# Patient Record
Sex: Male | Born: 1950 | Race: White | Hispanic: No | Smoking: Former smoker
Health system: Southern US, Community
[De-identification: ages and names within clinical notes are randomized; demographics above are authoritative.]

---

## 2008-08-24 ENCOUNTER — Ambulatory Visit: Payer: Self-pay | Admitting: Internal Medicine

## 2008-09-01 ENCOUNTER — Ambulatory Visit: Payer: Self-pay | Admitting: Internal Medicine

## 2009-07-19 ENCOUNTER — Ambulatory Visit: Payer: Self-pay | Admitting: Gastroenterology

## 2010-03-03 DIAGNOSIS — L570 Actinic keratosis: Secondary | ICD-10-CM

## 2010-03-03 HISTORY — DX: Actinic keratosis: L57.0

## 2012-05-03 ENCOUNTER — Ambulatory Visit: Payer: Self-pay | Admitting: Sports Medicine

## 2012-07-08 ENCOUNTER — Ambulatory Visit: Payer: Self-pay | Admitting: Anesthesiology

## 2012-07-08 DIAGNOSIS — I10 Essential (primary) hypertension: Secondary | ICD-10-CM

## 2012-07-08 LAB — BASIC METABOLIC PANEL
Anion Gap: 8 (ref 7–16)
Calcium, Total: 8 mg/dL — ABNORMAL LOW (ref 8.5–10.1)
Chloride: 110 mmol/L — ABNORMAL HIGH (ref 98–107)
Co2: 25 mmol/L (ref 21–32)
Creatinine: 0.81 mg/dL (ref 0.60–1.30)
EGFR (African American): >60
EGFR (Non-African Amer.): >60
Glucose: 161 mg/dL — ABNORMAL HIGH (ref 65–99)
Osmolality: 287 (ref 275–301)
Sodium: 143 mmol/L (ref 136–145)

## 2012-07-11 ENCOUNTER — Ambulatory Visit: Payer: Self-pay | Admitting: Orthopedic Surgery

## 2015-01-19 NOTE — Op Note (Signed)
PATIENT NAME:  Ronnie Buckley, Ronnie Buckley MR#:  [id number] DATE OF BIRTH:  07/20/1951  DATE OF PROCEDURE:  07/11/2012  PREOPERATIVE DIAGNOSIS: Right knee osteoarthritis, medial meniscus tear.   POSTOPERATIVE DIAGNOSIS: Right knee osteoarthritis, medial meniscus tear.   PROCEDURE: Arthroscopy of right knee, partial medial meniscectomy.   SURGEON: Laurene Footman, M.D.   ANESTHESIA: General.  DESCRIPTION OF PROCEDURE: The patient was brought to the Operating Room and, after adequate general anesthesia was obtained, the right leg was prepped and draped in the usual sterile fashion with the arthroscopic legholder and tourniquet applied; the tourniquet was not required. After appropriate patient identification and time out procedures were completed, an inferolateral portal was made. The arthroscope was introduced. Initial inspection revealed significant degenerative change with full thickness loss within the femoral trochlea, fissuring, and significant articular loss to the patella. The suprapatellar pouch was normal. The gutters were checked and there were no loose bodies. Coming around medially, an inferomedial portal was made and on probing there was a significant tear of the posterior horn of the medial meniscus involving approximately two thirds of the depth of the posterior horn from the junction of the middle and posterior thirds to the middle of the posterior third. There was fibrillation of the articular cartilage, in the medial compartment, without fissuring and no exposed bone. The anterior cruciate ligament was intact. The lateral compartment was essentially normal with just some fibrillation of the articular cartilage and some central degenerative changes. Meniscal punch and ArthroCare wand were used to debride the meniscal tear back to a stable margin. After this was completed, an arthroscopic portal was inserted to remove the large fragments from the meniscal punch. The knee was then irrigated until  clear. There were no complications and no specimen. After removal of all instrumentation, the wounds were closed with simple interrupted 4-0 nylon. 20 mL of 0.5% Sensorcaine with epinephrine was infiltrated into the portals. Xeroform, 4 x 4's, Webril, and Ace wrap were applied.   COMPLICATIONS: None.   SPECIMEN: None.   IMPLANTS: None.   ESTIMATED BLOOD LOSS: Minimal.  ____________________________ Laurene Footman, MD mjm:slb D: 07/11/2012 23:08:22 ET T: 07/12/2012 10:19:17 ET JOB#: [id number]  cc: Laurene Footman, MD, <Dictator> Laurene Footman MD ELECTRONICALLY SIGNED 07/12/2012 15:17

## 2015-12-02 DIAGNOSIS — M170 Bilateral primary osteoarthritis of knee: Secondary | ICD-10-CM | POA: Insufficient documentation

## 2018-10-24 DIAGNOSIS — E1165 Type 2 diabetes mellitus with hyperglycemia: Secondary | ICD-10-CM | POA: Insufficient documentation

## 2020-01-21 ENCOUNTER — Ambulatory Visit (INDEPENDENT_AMBULATORY_CARE_PROVIDER_SITE_OTHER): Payer: Medicare HMO | Admitting: Dermatology

## 2020-01-21 ENCOUNTER — Other Ambulatory Visit: Payer: Self-pay

## 2020-01-21 DIAGNOSIS — D692 Other nonthrombocytopenic purpura: Secondary | ICD-10-CM

## 2020-01-21 DIAGNOSIS — L578 Other skin changes due to chronic exposure to nonionizing radiation: Secondary | ICD-10-CM

## 2020-01-21 DIAGNOSIS — L570 Actinic keratosis: Secondary | ICD-10-CM

## 2020-01-21 DIAGNOSIS — L821 Other seborrheic keratosis: Secondary | ICD-10-CM | POA: Diagnosis not present

## 2020-01-21 NOTE — Progress Notes (Signed)
   Follow-Up Visit   Subjective  Ronnie Buckley is a 69 y.o. male who presents for the following: Actinic Keratosis (3 months f/u, hx of Aks, treated scalp and face with PDT 3 months ago with a good response).   The following portions of the chart were reviewed this encounter and updated as appropriate:  Allergies  Meds  Problems  Med Hx  Surg Hx  Fam Hx      Review of Systems:  No other skin or systemic complaints except as noted in HPI or Assessment and Plan.  Objective  Well appearing patient in no apparent distress; mood and affect are within normal limits.  A focused examination was performed including face,scalp . Relevant physical exam findings are noted in the Assessment and Plan.  Objective  face, scalp (9): Erythematous thin papules/macules with gritty scale.    Assessment & Plan   Actinic Damage - diffuse scaly erythematous macules with underlying dyspigmentation - Recommend daily broad spectrum sunscreen SPF 30+ to sun-exposed areas, reapply every 2 hours as needed.  - Call for new or changing lesions. Purpura - Violaceous macules and patches - Benign - Related to age, sun damage and/or use of blood thinners - Observe - Can use OTC arnica containing moisturizer such as Dermend Bruise Formula if desired - Call for worsening or other concerns Seborrheic Keratoses - Stuck-on, waxy, tan-brown papules and plaques  - Discussed benign etiology and prognosis. - Observe - Call for any changes AK (actinic keratosis) (9) face, scalp  Destruction of lesion - face, scalp Complexity: simple   Destruction method: cryotherapy   Informed consent: discussed and consent obtained   Timeout:  patient name, date of birth, surgical site, and procedure verified Lesion destroyed using liquid nitrogen: Yes   Region frozen until ice ball extended beyond lesion: Yes   Outcome: patient tolerated procedure well with no complications   Post-procedure details: wound care  instructions given    Return in about 6 months (around 07/22/2020) for Aks .  IMarye Round, CMA, am acting as scribe for Sarina Ser, MD .  Documentation: I have reviewed the above documentation for accuracy and completeness, and I agree with the above.  Sarina Ser, MD

## 2020-01-22 ENCOUNTER — Encounter: Payer: Self-pay | Admitting: Dermatology

## 2020-07-08 DIAGNOSIS — C029 Malignant neoplasm of tongue, unspecified: Secondary | ICD-10-CM | POA: Insufficient documentation

## 2020-07-21 ENCOUNTER — Ambulatory Visit: Payer: Medicare HMO | Admitting: Dermatology

## 2020-08-02 DIAGNOSIS — E7800 Pure hypercholesterolemia, unspecified: Secondary | ICD-10-CM | POA: Insufficient documentation

## 2020-08-02 DIAGNOSIS — I10 Essential (primary) hypertension: Secondary | ICD-10-CM | POA: Insufficient documentation

## 2020-08-23 DIAGNOSIS — S81801S Unspecified open wound, right lower leg, sequela: Secondary | ICD-10-CM | POA: Insufficient documentation

## 2020-08-23 DIAGNOSIS — R1312 Dysphagia, oropharyngeal phase: Secondary | ICD-10-CM | POA: Insufficient documentation

## 2020-08-23 DIAGNOSIS — Z9049 Acquired absence of other specified parts of digestive tract: Secondary | ICD-10-CM | POA: Insufficient documentation

## 2020-08-23 DIAGNOSIS — F800 Phonological disorder: Secondary | ICD-10-CM | POA: Insufficient documentation

## 2020-09-06 ENCOUNTER — Telehealth: Payer: Self-pay | Admitting: *Deleted

## 2020-09-06 ENCOUNTER — Inpatient Hospital Stay: Payer: Medicare HMO | Attending: Oncology | Admitting: Oncology

## 2020-09-06 ENCOUNTER — Other Ambulatory Visit: Payer: Self-pay

## 2020-09-06 ENCOUNTER — Ambulatory Visit
Admission: RE | Admit: 2020-09-06 | Discharge: 2020-09-06 | Disposition: A | Discharge status: Home / Self Care | Payer: Medicare HMO | Source: Ambulatory Visit | Attending: Radiation Oncology | Admitting: Radiation Oncology

## 2020-09-06 ENCOUNTER — Inpatient Hospital Stay: Payer: Medicare HMO

## 2020-09-06 ENCOUNTER — Other Ambulatory Visit: Payer: Self-pay | Admitting: *Deleted

## 2020-09-06 ENCOUNTER — Encounter: Payer: Self-pay | Admitting: Oncology

## 2020-09-06 VITALS — BP 110/60 | HR 86 | Temp 97.1°F | Resp 12 | Wt 199.7 lb

## 2020-09-06 VITALS — BP 110/60 | HR 86 | Temp 97.1°F | Resp 12 | Ht 70.0 in | Wt 199.9 lb

## 2020-09-06 DIAGNOSIS — I10 Essential (primary) hypertension: Secondary | ICD-10-CM | POA: Insufficient documentation

## 2020-09-06 DIAGNOSIS — Z7982 Long term (current) use of aspirin: Secondary | ICD-10-CM | POA: Insufficient documentation

## 2020-09-06 DIAGNOSIS — R1312 Dysphagia, oropharyngeal phase: Secondary | ICD-10-CM | POA: Insufficient documentation

## 2020-09-06 DIAGNOSIS — E1165 Type 2 diabetes mellitus with hyperglycemia: Secondary | ICD-10-CM | POA: Diagnosis not present

## 2020-09-06 DIAGNOSIS — D649 Anemia, unspecified: Secondary | ICD-10-CM | POA: Diagnosis not present

## 2020-09-06 DIAGNOSIS — C029 Malignant neoplasm of tongue, unspecified: Secondary | ICD-10-CM

## 2020-09-06 DIAGNOSIS — E7800 Pure hypercholesterolemia, unspecified: Secondary | ICD-10-CM | POA: Insufficient documentation

## 2020-09-06 DIAGNOSIS — Z7189 Other specified counseling: Secondary | ICD-10-CM | POA: Diagnosis not present

## 2020-09-06 DIAGNOSIS — C01 Malignant neoplasm of base of tongue: Secondary | ICD-10-CM | POA: Insufficient documentation

## 2020-09-06 DIAGNOSIS — Z7984 Long term (current) use of oral hypoglycemic drugs: Secondary | ICD-10-CM | POA: Insufficient documentation

## 2020-09-06 DIAGNOSIS — Z79899 Other long term (current) drug therapy: Secondary | ICD-10-CM | POA: Diagnosis not present

## 2020-09-06 DIAGNOSIS — Z9221 Personal history of antineoplastic chemotherapy: Secondary | ICD-10-CM | POA: Diagnosis not present

## 2020-09-06 DIAGNOSIS — Z930 Tracheostomy status: Secondary | ICD-10-CM | POA: Insufficient documentation

## 2020-09-06 NOTE — Telephone Encounter (Signed)
Called the office and spoke to tabitha and she read the notes from Dr. Nicolette Bang and it says that when he sees him next he will remove the trach at that time and get g tube insertion. Dr. Janese Banks wanted to know if pt could get port placed when he got thes procedures done.  Lawerance Bach said she would leave the doctor a note and see what he says. She did state that if he only needs Gtube it is doen by general surgery and Dr. Nicolette Bang would need to put order in to get port placed. She  will call me back with answer

## 2020-09-06 NOTE — Consult Note (Signed)
NEW PATIENT EVALUATION  Name: Ronnie Buckley  MRN: [id number]  Date:   09/06/2020     DOB: 11/01/1950   This 69 y.o. 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  Total glossectomy trach bilateral lymph node dissection.  REFERRING PHYSICIAN: Fredricka Bonine, *  CHIEF COMPLAINT:  Chief Complaint  Patient presents with  . tongue cancer    DIAGNOSIS: The encounter diagnosis was Tongue cancer ([place]).   PREVIOUS INVESTIGATIONS:  PET CT scan ordered CT scan report reviewed Pathology report reviewed Clinical notes reviewed  HPI: Patient is a 69 year old male who is now status post total glossectomy bilateral lymph node dissection and tracheostomy for a 4.6 cm moderately differentiated squamous cell carcinoma with margins positive in the deep and lateral edges lymphovascular invasion identified as well as perineural invasion as well as 1 lymph node in the neck dissection positive for metastatic disease.  He initially presented with a left oral tongue lesion which gradually worsened.  He did develop some minor speech changes and dysphagia.  Biopsy was performed showing squamous cell carcinoma.  He underwent above needed noted surgery with positive margins and 1 lymph node involved.  Tumor board at [facility] has recommended concurrent chemoradiation therapy in the adjuvant setting.  He is seen today is doing fair he still has open wound in his left neck with which is hemorrhaging slightly.  He has a tracheostomy and has a feeding tube at this time.  Patient to my knowledge and review of data has never had a PET CT scan.  He is seen today for both medical oncology and radiation oncology opinion.  PLANNE IMRT radiation therapy with concurrent chemotherapy D TREATMENT REGIMEN: As above  PAST MEDICAL HISTORY:  has no past medical history on file.    PAST SURGICAL  HISTORY: No past surgical history on file.  FAMILY HISTORY: family history is not on file.  SOCIAL HISTORY:  reports previous alcohol use. He reports that he does not use drugs.  ALLERGIES: Patient has no allergy information on record.  MEDICATIONS:  Current Outpatient Medications  Medication Sig Dispense Refill  . acetaminophen (TYLENOL) 500 MG tablet Take 2 tablets by mouth every 6 (six) hours as needed.    Marland Kitchen aspirin 325 MG tablet Take 1 tablet by mouth in the morning and at bedtime.    . enalapril (VASOTEC) 10 MG tablet Take 10 mg by mouth daily.    . metFORMIN (GLUCOPHAGE) 500 MG tablet Take 1,000 mg by mouth 2 (two) times daily.    . Multiple Vitamin (MULTI-VITAMIN) tablet Take 1 tablet by mouth daily.    . pravastatin (PRAVACHOL) 40 MG tablet Take 1 tablet by mouth daily.     No current facility-administered medications for this encounter.    ECOG PERFORMANCE STATUS:  1 - Symptomatic but completely ambulatory  REVIEW OF SYSTEMS: Patient denies any weight loss, fatigue, weakness, fever, chills or night sweats. Patient denies any loss of vision, blurred vision. Patient denies any ringing  of the ears or hearing loss. No irregular heartbeat. Patient denies heart murmur or history of fainting. Patient denies any chest pain or pain radiating to her upper extremities. Patient denies any shortness of breath, difficulty breathing at night, cough or hemoptysis. Patient denies any swelling in the lower legs. Patient denies any nausea vomiting, vomiting of blood, or coffee ground material in the vomitus. Patient denies any stomach  pain. Patient states has had normal bowel movements no significant constipation or diarrhea. Patient denies any dysuria, hematuria or significant nocturia. Patient denies any problems walking, swelling in the joints or loss of balance. Patient denies any skin changes, loss of hair or loss of weight. Patient denies any excessive worrying or anxiety or significant  depression. Patient denies any problems with insomnia. Patient denies excessive thirst, polyuria, polydipsia. Patient denies any swollen glands, patient denies easy bruising or easy bleeding. Patient denies any recent infections, allergies or URI. Patient "s visual fields have not changed significantly in recent time.   PHYSICAL EXAM: BP 110/60 (BP Location: Left Arm, Patient Position: Sitting, Cuff Size: Large)   Pulse 86   Temp (!) 97.1 F (36.2 C)   Resp 12   Wt 199 lb 11.2 oz (90.6 kg)  Patient has a feeding tube placed.  He also has a tracheostomy which is functioning well.  He does have some bandaged although ulcerative area in his left neck consistent with healing of his neck dissection.  Well-developed well-nourished patient in NAD. HEENT reveals PERLA, EOMI, discs not visualized.  Oral cavity is clear. No oral mucosal lesions are identified. Neck is clear without evidence of cervical or supraclavicular adenopathy. Lungs are clear to A&P. Cardiac examination is essentially unremarkable with regular rate and rhythm without murmur rub or thrill. Abdomen is benign with no organomegaly or masses noted. Motor sensory and DTR levels are equal and symmetric in the upper and lower extremities. Cranial nerves II through XII are grossly intact. Proprioception is intact. No peripheral adenopathy or edema is identified. No motor or sensory levels are noted. Crude visual fields are within normal range.  LABORATORY DATA: Pathology report reviewed    RADIOLOGY RESULTS: CT report reviewed PET CT scan ordered   IMPRESSION: Locally advanced squamous cell carcinoma of the tongue status post total glossectomy bilateral lymph node dissection tracheostomy in 69 year old male  PLAN: At this time I like to order a PET CT scan to evaluate if there is any possible gross residual disease.  We will plan on delivering adjuvant radiation therapy to his head and neck based on PET/CT findings as well as his CT scans.   Would plan on delivering 60 Gray over 6 weeks with concurrent chemotherapy to his left neck as well as area of original tumor involvement.  Do not see need to treat other areas of lymph node involvement in his head and neck region.  Risks and benefits of treatment including loss of taste oral mucositis fatigue possible radiation esophagitis skin reaction alteration of blood counts and xerostomia all were reviewed in detail with the patient.  He comprehends her treatment plan well.  I have set him up for CT simulation after his PET CT scan in about 3 weeks time to allow some more healing of his open neck wound.  He is also seeing medical oncology today and will discuss the case personally with her when she has consult with patient.  I would like to take this opportunity to thank you for allowing me to participate in the care of your patient.Noreene Filbert, MD

## 2020-09-08 ENCOUNTER — Encounter: Payer: Self-pay | Admitting: Oncology

## 2020-09-08 DIAGNOSIS — Z7189 Other specified counseling: Secondary | ICD-10-CM | POA: Insufficient documentation

## 2020-09-08 NOTE — Progress Notes (Signed)
START ON PATHWAY REGIMEN - Head and Neck     A cycle is every 7 days:     Cisplatin   **Always confirm dose/schedule in your pharmacy ordering system**  Patient Characteristics: Oral Cavity, Postoperative without Neoadjuvant Therapy, ENE or Positive Margins Disease Classification: Oral Cavity AJCC T Category: T4a AJCC M Category: M0 AJCC N Category: pN1 AJCC 8 Stage Grouping: IVA Therapeutic Status: Postoperative without Neoadjuvant Therapy Intent of Therapy: Curative Intent, Discussed with Patient

## 2020-09-08 NOTE — Progress Notes (Signed)
Hematology/Oncology Consult note [facility] Telephone:([phone] [phone]  Patient Care Team: Patient, No Pcp Per as PCP - General (General Practice)   Name of the patient: Ronnie Buckley  [id number]  December 27, 1950    Reason for referral-new diagnosis of tongue cancer   Referring physician-Dr. Elisabeth Cara  Date of visit: 09/08/20   History of presenting illness- Patient is a 69 year old male who presented to [facility] ENT Dr. Conley Canal for a lesion that he identified on his left tongue.  Biopsy on 06/15/2020 was consistent with squamous cell carcinoma.  This was followed by a CT neck and chest.  CT neck showed a large poorly defined infiltrative mass throughout the posterior left oral tongue and left oral base extending to the left glossotonsillar sulcus.  The mass crosses midline through the lingual septum into the right hemitongue.  Findings are concerning for left lateral retropharyngeal as well as level 1B283 and 4 nodal metastases.  CT chest without contrast did not show any evidence of metastatic disease.  Patient then underwent total glossectomy with bilateral neck dissection on 07/15/2020.  Pathology showed:Surgical Pathology Final Pathologic Diagnosis  A. LYMPH NODE, LEFT LEVEL 1B, DISSECTION: Benign salivary gland tissue. One out of six lymph nodes, positive for metastatic carcinoma (1/6).  B. LYMPH NODES, LEFT LEVEL 2A, 3, & 4, DISSECTION: Sixteen lymph nodes, negative for metastatic carcinoma (0/16).  C. LYMPH NODES, LEFT LEVEL 2B, DISSECTION: Seven lymph nodes, negative for metastatic carcinoma (0/7).  D. LYMPH NODES, LEFT LEVEL 1A, DISSECTION: One lymph node, negative for metastatic carcinoma (0/1).  E. LYMPH NODES, RIGHT LEVEL 2A, 3, &4, DISSECTION: Eleven lymph nodes, negative for metastatic carcinoma (0/11).  F. LYMPH NODES, RIGHT LEVEL 2A AND 2B, DISSECTION: Eleven lymph nodes, negative for metastatic carcinoma(0/11).  G.  LINGUAL NERVE, RIGHT, EXCISION: Benign nerve tissue. No malignancy identified.  H. LYMPH NODES, RIGHT LEVEL 1B, DISSECTION: Benign salivary gland tissue. Three lymph nodes, negative for metastatic carcinoma. (0/3).  I. LINGUAL NERVE, PROXIMAL LEFT MARGIN, EXCISION: Benign peripheral nerve. No malignancy identified.  J. FLOOR OF MOUTH, LEFT MARGIN, EXCISION: Benign oropharyngeal mucosa. No malignancy identified.  K. SOFT PALATE, LEFT MARGIN, EXCISION: Benign oropharyngeal mucosa. No malignancy identified.  L. PHARYNGEAL MUCOSAL MARGIN, LEFT, EXCISION: Benign oropharyngeal mucosa. No malignancy identified.  M. VALLECULA MUCOSA MARGIN, EXCISION: Benign oropharyngeal mucosa. No malignancy identified.  N. PTERYGOID, DEEP TONSILLAR FOSSA MARGIN, EXCISION: Benign soft tissue. No malignancy identified.  O. TONGUE, TOTAL GLOSSECTOMY: Invasive moderately differentiated squamous cell carcinoma, keratinizing (4.6 cm). Tumor focally present at the deep and left tissue edges, please see separated margin status.  Lymphovascular invasion identified.  Perineural invasion is identified. Pathologic stage: pT4a pN1.  See CAP synoptic report.    Patient was evaluated by radiation oncology at [facility] and plan was to offer adjuvant concurrent chemoradiation.  However patient lives in [place] and prefers to get care locally.  He currently has a tracheostomy in place but has not used it in 2 weeks and plans to take it off in the next few weeks.  He will also need PEG tube for feeding as he still has oropharyngeal dysphagia following glossectomy.  Currently he has a NG tube in place for feeding.  ECOG PS- 1 Pain scale- 0   Review of systems- Review of Systems  Constitutional: Positive for malaise/fatigue and weight loss. Negative for chills and fever.  HENT: Negative for congestion, ear discharge and nosebleeds.   Eyes: Negative for blurred vision.  Respiratory: Negative for  cough, hemoptysis, sputum production, shortness of breath and wheezing.   Cardiovascular: Negative for chest pain, palpitations, orthopnea and claudication.  Gastrointestinal: Negative for abdominal pain, blood in stool, constipation, diarrhea, heartburn, melena, nausea and vomiting.  Genitourinary: Negative for dysuria, flank pain, frequency, hematuria and urgency.  Musculoskeletal: Negative for back pain, joint pain and myalgias.  Skin: Negative for rash.  Neurological: Negative for dizziness, tingling, focal weakness, seizures, weakness and headaches.  Endo/Heme/Allergies: Does not bruise/bleed easily.  Psychiatric/Behavioral: Negative for depression and suicidal ideas. The patient does not have insomnia.     Not on File  Patient Active Problem List   Diagnosis Date Noted  . Open wound of leg without complication, right, sequela 08/23/2020  . Oropharyngeal dysphagia 08/23/2020  . S/P glossectomy 08/23/2020  . 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 Hypercholesterolemia 08/02/2020  . Hypertension 08/02/2020  . Tongue cancer ([place]) 07/08/2020  . Type 2 diabetes mellitus with hyperglycemia, without long-term current use of insulin ([place]) 10/24/2018  . Primary osteoarthritis of both knees 12/02/2015     History reviewed. No pertinent past medical history.   History reviewed. No pertinent surgical history.  Social History   Socioeconomic History  . Marital status: Married    Spouse name: Not on file  . Number of children: Not on file  . Years of education: Not on file  . Highest education level: Not on file  Occupational History  . Not on file  Tobacco Use  . Smoking status: Not on file  Substance and Sexual Activity  . Alcohol use: Not Currently  . Drug use: Never  . Sexual activity: Not Currently  Other Topics Concern  . Not on file  Social History Narrative  . Not on file   Social Determinants of Health   Financial Resource Strain:   . Difficulty of Paying  Living Expenses: Not on file  Food Insecurity:   . Worried About Charity fundraiser in the Last Year: Not on file  . Ran Out of Food in the Last Year: Not on file  Transportation Needs:   . Lack of Transportation (Medical): Not on file  . Lack of Transportation (Non-Medical): Not on file  Physical Activity:   . Days of Exercise per Week: Not on file  . Minutes of Exercise per Session: Not on file  Stress:   . Feeling of Stress : Not on file  Social Connections:   . Frequency of Communication with Friends and Family: Not on file  . Frequency of Social Gatherings with Friends and Family: Not on file  . Attends Religious Services: Not on file  . Active Member of Clubs or Organizations: Not on file  . Attends Archivist Meetings: Not on file  . Marital Status: Not on file  Intimate Partner Violence:   . Fear of Current or Ex-Partner: Not on file  . Emotionally Abused: Not on file  . Physically Abused: Not on file  . Sexually Abused: Not on file     History reviewed. No pertinent family history.   Current Outpatient Medications:  .  acetaminophen (TYLENOL) 500 MG tablet, Take 2 tablets by mouth every 6 (six) hours as needed., Disp: , Rfl:  .  aspirin 325 MG tablet, Take 1 tablet by mouth in the morning and at bedtime., Disp: , Rfl:  .  enalapril (VASOTEC) 10 MG tablet, Take 10 mg by mouth daily., Disp: , Rfl:  .  metFORMIN (GLUCOPHAGE) 500 MG tablet, Take 1,000 mg by mouth 2 (two)  times daily., Disp: , Rfl:  .  Multiple Vitamin (MULTI-VITAMIN) tablet, Take 1 tablet by mouth daily., Disp: , Rfl:  .  pravastatin (PRAVACHOL) 40 MG tablet, Take 1 tablet by mouth daily., Disp: , Rfl:    Physical exam:  Vitals:   09/06/20 1127  BP: 110/60  Pulse: 86  Resp: 12  Temp: (!) 97.1 F (36.2 C)  SpO2: 96%  Weight: 199 lb 14.4 oz (90.7 kg)   Physical Exam HENT:     Head: Normocephalic and atraumatic.     Mouth/Throat:     Comments: Patient is s/p total glossectomy with  reconstruction.  Speech is somewhat garbled but comprehensible.  Tracheostomy in place which is currently close.  NG tube in place Cardiovascular:     Rate and Rhythm: Normal rate and regular rhythm.     Heart sounds: Normal heart sounds.  Pulmonary:     Effort: Pulmonary effort is normal.     Breath sounds: Normal breath sounds.  Abdominal:     General: Bowel sounds are normal.     Palpations: Abdomen is soft.  Skin:    General: Skin is warm and dry.  Neurological:     Mental Status: He is alert and oriented to person, place, and time.        Assessment and plan- Patient is a 69 y.o. male with newly diagnosed squamous cell carcinoma of the oral cavity/tongue stage IVa pT4 apN1 cM0 s/p total glossectomy and bilateral neck dissection  Given that final pathology showed T4 lesion with perineural invasion as well as lymphovascular invasion, no ECE with questionable/close margins, patient would benefit from adjuvant concurrent chemoradiation.  Patient has been seen by radiation oncology today and will likely get 7 weeks of radiation.  I will plan to give him weekly cisplatin at 40 mg per metered square along with radiation.  Discussed risks and benefits of cisplatin including all but not limited to nausea, vomiting, low blood counts, risk of infections and hospitalization.  Risk of hair loss, peripheral neuropathy, hearing loss, kidney dysfunction associated with cisplatin.  Patient understands and agrees to proceed as planned.  Treatment will be given with a curative intent.  Patient will need a port placement which she can get done at [facility] when he has a feeding tube placed.  At the same time he is getting his tracheostomy taken out as well.  Dr. Donella Stade has also ordered a PET scan for him at this time.  Will need chemo teach prior to starting chemotherapy.  Patient noted to have normocytic anemia on his recent labs with normal renal functions.  I will do a complete anemia work-up  including CBC ferritin and iron studies B12 and folate and reticulocyte count on the day of his chemotherapy.  I will tentatively see him in 2 weeks time to start first cycle of weekly cisplatin chemotherapy  Cancer Staging Tongue cancer [facility]) Staging form: Oral Cavity, AJCC 8th Edition - Pathologic stage from 09/06/2020: Stage IVA (pT4a, pN1, cM0) - Signed by Sindy Guadeloupe, MD on 09/08/2020     Thank you for this kind referral and the opportunity to participate in the care of this patient   Visit Diagnosis 1. Goals of care, counseling/discussion   2. Tongue cancer ([place])     Dr. Randa Evens, MD, MPH [facility] [phone] 09/08/2020 9:40 AM

## 2020-09-13 ENCOUNTER — Other Ambulatory Visit: Payer: Self-pay | Admitting: *Deleted

## 2020-09-13 DIAGNOSIS — D649 Anemia, unspecified: Secondary | ICD-10-CM

## 2020-09-13 DIAGNOSIS — C029 Malignant neoplasm of tongue, unspecified: Secondary | ICD-10-CM

## 2020-09-16 ENCOUNTER — Encounter
Admission: RE | Admit: 2020-09-16 | Discharge: 2020-09-16 | Disposition: A | Discharge status: Home / Self Care | Payer: Medicare HMO | Source: Ambulatory Visit | Attending: Radiation Oncology | Admitting: Radiation Oncology

## 2020-09-16 ENCOUNTER — Other Ambulatory Visit: Payer: Self-pay

## 2020-09-16 DIAGNOSIS — I7090 Unspecified atherosclerosis: Secondary | ICD-10-CM | POA: Diagnosis not present

## 2020-09-16 DIAGNOSIS — R609 Edema, unspecified: Secondary | ICD-10-CM | POA: Insufficient documentation

## 2020-09-16 DIAGNOSIS — K8020 Calculus of gallbladder without cholecystitis without obstruction: Secondary | ICD-10-CM | POA: Diagnosis not present

## 2020-09-16 DIAGNOSIS — C029 Malignant neoplasm of tongue, unspecified: Secondary | ICD-10-CM | POA: Diagnosis present

## 2020-09-16 LAB — GLUCOSE, CAPILLARY: Glucose-Capillary: 141 mg/dL — ABNORMAL HIGH (ref 70–99)

## 2020-09-16 MED ORDER — FLUDEOXYGLUCOSE F - 18 (FDG) INJECTION
10.3000 | Freq: Once | INTRAVENOUS | Status: AC | PRN
  Administered 2020-09-16: 10:00:00 10.689 via INTRAVENOUS

## 2020-09-20 ENCOUNTER — Ambulatory Visit: Admission: RE | Admit: 2020-09-20 | Payer: Medicare HMO | Source: Ambulatory Visit

## 2020-09-27 ENCOUNTER — Ambulatory Visit: Payer: Medicare HMO

## 2020-09-27 NOTE — Patient Instructions (Signed)

## 2020-09-28 ENCOUNTER — Inpatient Hospital Stay: Payer: Medicare HMO | Admitting: Nurse Practitioner

## 2020-09-28 ENCOUNTER — Inpatient Hospital Stay: Payer: Medicare HMO

## 2020-09-28 DIAGNOSIS — C029 Malignant neoplasm of tongue, unspecified: Secondary | ICD-10-CM

## 2020-09-28 MED ORDER — LIDOCAINE-PRILOCAINE 2.5-2.5 % EX CREA: TOPICAL_CREAM | CUTANEOUS | 3 refills | Status: DC

## 2020-09-29 ENCOUNTER — Inpatient Hospital Stay: Payer: Medicare HMO | Attending: Nurse Practitioner | Admitting: Hospice and Palliative Medicine

## 2020-09-29 ENCOUNTER — Ambulatory Visit: Payer: Medicare HMO

## 2020-09-29 DIAGNOSIS — C029 Malignant neoplasm of tongue, unspecified: Secondary | ICD-10-CM

## 2020-09-29 NOTE — Progress Notes (Signed)
[facility] CARE CLINIC CONSULT NOTE [facility]  Telephone:([phone] [phone]  Patient Care Team: Barbette Reichmann, MD as PCP - General (Internal Medicine)   Name of the patient: Ronnie Buckley  [id number]  01-Feb-1951   I connected with Ronnie Buckley on 09/29/20 at 11:00 AM EST by telephone and verified that I am speaking with the correct person using two identifiers.  Location: Patient: home Provider: clinic   I discussed the limitations, risks, security and privacy concerns of performing an evaluation and management service by telephone and the availability of in person appointments. I also discussed with the patient that there may be a patient responsible charge related to this service. The patient expressed understanding and agreed to proceed.   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 male who presents to chemo care clinic today for initial meeting in preparation for starting chemotherapy. I introduced the chemo care clinic and we discussed that the role of the clinic is to assist those who are at an increased risk of emergency room visits and/or complications during the course of chemotherapy treatment. We discussed that the increased risk takes into account factors such as age, performance status, and co-morbidities. We also discussed that for some, this might include barriers to care such as not having a primary care provider, lack of insurance/transportation, or not being able to afford medications. We discussed that the goal of the program is to help prevent unplanned ER visits and help reduce complications during chemotherapy. We do this by discussing specific risk factors to each individual and identifying ways that we can help improve these risk factors and reduce barriers to care.   Hematology/Oncology History:  Oncology History  Tongue cancer  (HCC)  07/08/2020 Initial Diagnosis   Tongue cancer (HCC)   09/06/2020 Cancer Staging   Staging form: Oral Cavity, AJCC 8th Edition - Pathologic stage from 09/06/2020: Stage IVA (pT4a, pN1, cM0) - Signed by Creig Hines, MD on 09/08/2020   10/04/2020 -  Chemotherapy   The patient had dexamethasone (DECADRON) 4 MG tablet, 8 mg, Oral, Daily, 1 of 1 cycle, Start date: --, End date: -- palonosetron (ALOXI) injection 0.25 mg, 0.25 mg, Intravenous,  Once, 0 of 7 cycles CISplatin (PLATINOL) 84 mg in sodium chloride 0.9 % 250 mL chemo infusion, 40 mg/m2 = 84 mg, Intravenous,  Once, 0 of 7 cycles fosaprepitant (EMEND) 150 mg in sodium chloride 0.9 % 145 mL IVPB, 150 mg, Intravenous,  Once, 0 of 7 cycles  for chemotherapy treatment.       No Known Allergies   No past medical history on file.   No past surgical history on file.  Social History   Socioeconomic History  . Marital status: Married    Spouse name: Not on file  . Number of children: Not on file  . Years of education: Not on file  . Highest education level: Not on file  Occupational History  . Not on file  Tobacco Use  . Smoking status: Not on file  . Smokeless tobacco: Not on file  Substance and Sexual Activity  . Alcohol use: Not Currently  . Drug use: Never  . Sexual activity: Not Currently  Other Topics Concern  . Not on file  Social History Narrative  . Not on file   Social Determinants of Health   Financial Resource Strain: Not on file  Food  Insecurity: Not on file  Transportation Needs: Not on file  Physical Activity: Not on file  Stress: Not on file  Social Connections: Not on file  Intimate Partner Violence: Not on file    No family history on file.  Current Outpatient Medications  Medication Sig Dispense Refill  . acetaminophen (TYLENOL) 500 MG tablet Take 2 tablets by mouth every 6 (six) hours as needed.    Marland Kitchen aspirin 325 MG tablet Take 1 tablet by mouth in the morning and at bedtime.    .  enalapril (VASOTEC) 10 MG tablet Take 10 mg by mouth daily.    Marland Kitchen lidocaine-prilocaine (EMLA) cream Apply to affected area once 30 g 3  . metFORMIN (GLUCOPHAGE) 500 MG tablet Take 1,000 mg by mouth 2 (two) times daily.    . Multiple Vitamin (MULTI-VITAMIN) tablet Take 1 tablet by mouth daily.    . pravastatin (PRAVACHOL) 40 MG tablet Take 1 tablet by mouth daily.     No current facility-administered medications for this visit.     PERFORMANCE STATUS (ECOG) : 1 - Symptomatic but completely ambulatory  Review of Systems As noted above. Otherwise, a complete review of systems is negative.  Physical Exam Deferred   Assessment and Plan:    1. 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 High Risk for ER/Hospitalization during Chemotherapy: We discussed the role of the chemo care clinic and identified patient specific risk factors. I discussed that patient was identified as high risk primarily based on: H&N Cancer. We also discussed the role of the Symptom Management and Palliative Care Clinics at [facility] and methods of contacting clinic/provider. He denies needing specific assistance at this time.   Recommend referral to nutritionist and ST.  Patient is pending PEG at [facility] and will likely need ongoing nutritional follow-up.  Palliative care could also be considered if felt to be clinically appropriate with advanced stage disease.   Patient expressed understanding and was in agreement with this plan. He also understands that He can call clinic at any time with any questions, concerns, or complaints.   A total of (10) minutes of face-to-face time was spent with this patient with greater than 50% of that time in counseling and care-coordination.   Signed by: Altha Harm, PhD, NP-C

## 2020-10-04 ENCOUNTER — Ambulatory Visit: Payer: Medicare HMO

## 2020-10-04 ENCOUNTER — Ambulatory Visit: Payer: Medicare HMO | Admitting: Oncology

## 2020-10-04 ENCOUNTER — Telehealth: Payer: Self-pay | Admitting: Oncology

## 2020-10-04 ENCOUNTER — Other Ambulatory Visit: Payer: Medicare HMO

## 2020-10-04 NOTE — Telephone Encounter (Signed)
Call placed to pt to schedule nutrition appointment, unable to reach pt. Left message to return call.

## 2020-10-11 ENCOUNTER — Ambulatory Visit: Payer: Medicare HMO

## 2020-10-12 ENCOUNTER — Ambulatory Visit: Payer: Medicare HMO

## 2020-10-13 ENCOUNTER — Ambulatory Visit: Payer: Medicare HMO

## 2020-10-14 ENCOUNTER — Ambulatory Visit
Admission: RE | Admit: 2020-10-14 | Discharge: 2020-10-14 | Disposition: A | Discharge status: Home / Self Care | Payer: Medicare HMO | Source: Ambulatory Visit | Attending: Radiation Oncology | Admitting: Radiation Oncology

## 2020-10-14 ENCOUNTER — Other Ambulatory Visit: Payer: Self-pay | Admitting: *Deleted

## 2020-10-14 DIAGNOSIS — Z510 Encounter for antineoplastic radiation therapy: Secondary | ICD-10-CM | POA: Diagnosis not present

## 2020-10-14 DIAGNOSIS — C029 Malignant neoplasm of tongue, unspecified: Secondary | ICD-10-CM | POA: Diagnosis present

## 2020-10-14 MED ORDER — LIDOCAINE-PRILOCAINE 2.5-2.5 % EX CREA: 1.0000 "application " | TOPICAL_CREAM | CUTANEOUS | 2 refills | Status: DC

## 2020-10-20 DIAGNOSIS — Z510 Encounter for antineoplastic radiation therapy: Secondary | ICD-10-CM | POA: Diagnosis not present

## 2020-10-21 ENCOUNTER — Ambulatory Visit: Admission: RE | Admit: 2020-10-21 | Payer: Medicare HMO | Source: Ambulatory Visit

## 2020-10-25 ENCOUNTER — Inpatient Hospital Stay: Payer: Medicare HMO

## 2020-10-25 ENCOUNTER — Inpatient Hospital Stay: Payer: Medicare HMO | Attending: Oncology | Admitting: Oncology

## 2020-10-25 ENCOUNTER — Ambulatory Visit
Admission: RE | Admit: 2020-10-25 | Discharge: 2020-10-25 | Disposition: A | Discharge status: Home / Self Care | Payer: Medicare HMO | Source: Ambulatory Visit | Attending: Radiation Oncology | Admitting: Radiation Oncology

## 2020-10-25 ENCOUNTER — Other Ambulatory Visit: Payer: Self-pay

## 2020-10-25 VITALS — BP 97/60 | HR 73 | Temp 97.8°F | Resp 18 | Wt 191.0 lb

## 2020-10-25 DIAGNOSIS — Z7189 Other specified counseling: Secondary | ICD-10-CM | POA: Diagnosis not present

## 2020-10-25 DIAGNOSIS — Z5111 Encounter for antineoplastic chemotherapy: Secondary | ICD-10-CM | POA: Diagnosis not present

## 2020-10-25 DIAGNOSIS — C029 Malignant neoplasm of tongue, unspecified: Secondary | ICD-10-CM

## 2020-10-25 DIAGNOSIS — C778 Secondary and unspecified malignant neoplasm of lymph nodes of multiple regions: Secondary | ICD-10-CM | POA: Insufficient documentation

## 2020-10-25 DIAGNOSIS — Z510 Encounter for antineoplastic radiation therapy: Secondary | ICD-10-CM | POA: Diagnosis not present

## 2020-10-25 LAB — CBC WITH DIFFERENTIAL/PLATELET
Abs Immature Granulocytes: 0.01 10*3/uL (ref 0.00–0.07)
Basophils Absolute: 0 10*3/uL (ref 0.0–0.1)
Basophils Relative: 1 %
Eosinophils Absolute: 0.2 10*3/uL (ref 0.0–0.5)
Eosinophils Relative: 3 %
HCT: 34.3 % — ABNORMAL LOW (ref 39.0–52.0)
Hemoglobin: 11.4 g/dL — ABNORMAL LOW (ref 13.0–17.0)
Immature Granulocytes: 0 %
Lymphocytes Relative: 25 %
Lymphs Abs: 1.3 10*3/uL (ref 0.7–4.0)
MCH: 29 pg (ref 26.0–34.0)
MCHC: 33.2 g/dL (ref 30.0–36.0)
MCV: 87.3 fL (ref 80.0–100.0)
Monocytes Absolute: 0.5 10*3/uL (ref 0.1–1.0)
Monocytes Relative: 9 %
Neutro Abs: 3.3 10*3/uL (ref 1.7–7.7)
Neutrophils Relative %: 62 %
Platelets: 209 10*3/uL (ref 150–400)
RBC: 3.93 MIL/uL — ABNORMAL LOW (ref 4.22–5.81)
RDW: 14.4 % (ref 11.5–15.5)
WBC: 5.3 10*3/uL (ref 4.0–10.5)
nRBC: 0 % (ref 0.0–0.2)

## 2020-10-25 LAB — IRON AND TIBC
Iron: 48 ug/dL (ref 45–182)
Saturation Ratios: 12 % — ABNORMAL LOW (ref 17.9–39.5)
TIBC: 388 ug/dL (ref 250–450)
UIBC: 340 ug/dL

## 2020-10-25 LAB — BASIC METABOLIC PANEL
Anion gap: 9 (ref 5–15)
BUN: 26 mg/dL — ABNORMAL HIGH (ref 8–23)
CO2: 26 mmol/L (ref 22–32)
Calcium: 8.7 mg/dL — ABNORMAL LOW (ref 8.9–10.3)
Chloride: 102 mmol/L (ref 98–111)
Creatinine, Ser: 0.64 mg/dL (ref 0.61–1.24)
GFR, Estimated: >60 mL/min (ref >60)
Glucose, Bld: 104 mg/dL — ABNORMAL HIGH (ref 70–99)
Potassium: 4.2 mmol/L (ref 3.5–5.1)
Sodium: 137 mmol/L (ref 135–145)

## 2020-10-25 LAB — FERRITIN: Ferritin: 12 ng/mL — ABNORMAL LOW (ref 24–336)

## 2020-10-25 LAB — HEPATITIS B CORE ANTIBODY, TOTAL: Hep B Core Total Ab: NONREACTIVE

## 2020-10-25 LAB — MAGNESIUM: Magnesium: 2.3 mg/dL (ref 1.7–2.4)

## 2020-10-25 LAB — VITAMIN B12: Vitamin B-12: 430 pg/mL (ref 180–914)

## 2020-10-25 LAB — FOLATE: Folate: 54 ng/mL (ref >5.9)

## 2020-10-25 LAB — HEPATITIS B SURFACE ANTIGEN: Hepatitis B Surface Ag: NONREACTIVE

## 2020-10-25 MED ORDER — FOSAPREPITANT DIMEGLUMINE INJECTION 150 MG
150.0000 mg | Freq: Once | INTRAVENOUS | Status: AC
  Administered 2020-10-25: 150 mg via INTRAVENOUS
  Filled 2020-10-25: qty 150

## 2020-10-25 MED ORDER — SODIUM CHLORIDE 0.9 % IV SOLN
40.0000 mg/m2 | Freq: Once | INTRAVENOUS | Status: AC
  Administered 2020-10-25: 84 mg via INTRAVENOUS
  Filled 2020-10-25: qty 84

## 2020-10-25 MED ORDER — MAGNESIUM SULFATE 2 GM/50ML IV SOLN
2.0000 g | Freq: Once | INTRAVENOUS | Status: AC
  Administered 2020-10-25: 2 g via INTRAVENOUS
  Filled 2020-10-25: qty 50

## 2020-10-25 MED ORDER — LORAZEPAM 0.5 MG PO TABS: 0.5000 mg | ORAL_TABLET | Freq: Three times a day (TID) | ORAL | 0 refills | Status: AC | PRN

## 2020-10-25 MED ORDER — HEPARIN SOD (PORK) LOCK FLUSH 100 UNIT/ML IV SOLN
INTRAVENOUS | Status: AC
  Filled 2020-10-25: qty 5

## 2020-10-25 MED ORDER — PROCHLORPERAZINE MALEATE 10 MG PO TABS: 10.0000 mg | ORAL_TABLET | Freq: Four times a day (QID) | ORAL | 0 refills | Status: DC | PRN

## 2020-10-25 MED ORDER — ONDANSETRON HCL 8 MG PO TABS: 8.0000 mg | ORAL_TABLET | Freq: Three times a day (TID) | ORAL | 0 refills | Status: DC | PRN

## 2020-10-25 MED ORDER — SODIUM CHLORIDE 0.9 % IV SOLN
10.0000 mg | Freq: Once | INTRAVENOUS | Status: AC
  Administered 2020-10-25: 10 mg via INTRAVENOUS
  Filled 2020-10-25: qty 10

## 2020-10-25 MED ORDER — LORAZEPAM 0.5 MG PO TABS: 0.5000 mg | ORAL_TABLET | Freq: Three times a day (TID) | ORAL | 0 refills | Status: DC

## 2020-10-25 MED ORDER — SODIUM CHLORIDE 0.9 % IV SOLN
Freq: Once | INTRAVENOUS | Status: AC
  Filled 2020-10-25: qty 250

## 2020-10-25 MED ORDER — HEPARIN SOD (PORK) LOCK FLUSH 100 UNIT/ML IV SOLN
500.0000 [IU] | Freq: Once | INTRAVENOUS | Status: AC
  Administered 2020-10-25: 500 [IU] via INTRAVENOUS
  Filled 2020-10-25: qty 5

## 2020-10-25 MED ORDER — PALONOSETRON HCL INJECTION 0.25 MG/5ML
0.2500 mg | Freq: Once | INTRAVENOUS | Status: AC
  Administered 2020-10-25: 0.25 mg via INTRAVENOUS
  Filled 2020-10-25: qty 5

## 2020-10-25 MED ORDER — DEXAMETHASONE 4 MG PO TABS: 4.0000 mg | ORAL_TABLET | Freq: Two times a day (BID) | ORAL | 1 refills | Status: DC

## 2020-10-25 MED ORDER — HEPARIN SOD (PORK) LOCK FLUSH 100 UNIT/ML IV SOLN
500.0000 [IU] | Freq: Once | INTRAVENOUS | Status: DC | PRN
  Filled 2020-10-25: qty 5

## 2020-10-25 MED ORDER — POTASSIUM CHLORIDE IN NACL 20-0.9 MEQ/L-% IV SOLN
Freq: Once | INTRAVENOUS | Status: AC
  Filled 2020-10-25: qty 1000

## 2020-10-25 MED ORDER — SODIUM CHLORIDE 0.9% FLUSH
10.0000 mL | Freq: Once | INTRAVENOUS | Status: DC
  Filled 2020-10-25: qty 10

## 2020-10-25 NOTE — Addendum Note (Signed)
Addended by: Randa Evens C on: 10/25/2020 02:49 PM   Modules accepted: Orders

## 2020-10-25 NOTE — Progress Notes (Signed)
Hematology/Oncology Consult note [facility]  Telephone:([phone] [phone]  Patient Care Team: Tracie Harrier, MD as PCP - General (Internal Medicine)   Name of the patient: Ronnie Buckley  [id number]  1950-12-29   Date of visit: 10/25/20  Diagnosis- squamous cell carcinoma of the oral cavity/tongue stage IVa pT4 apN1 cM0   Chief complaint/ Reason for visit-on treatment assessment prior to cycle 1 of weekly cisplatin chemotherapy  Heme/Onc history: Patient is a 70 year old male who presented to [facility] ENT Dr. Conley Canal for a lesion that he identified on his left tongue.  Biopsy on 06/15/2020 was consistent with squamous cell carcinoma.  This was followed by a CT neck and chest.  CT neck showed a large poorly defined infiltrative mass throughout the posterior left oral tongue and left oral base extending to the left glossotonsillar sulcus.  The mass crosses midline through the lingual septum into the right hemitongue.  Findings are concerning for left lateral retropharyngeal as well as level 1B283 and 4 nodal metastases.  CT chest without contrast did not show any evidence of metastatic disease.  Patient then underwent total glossectomy with bilateral neck dissection on 07/15/2020.  Pathology showed:Surgical Pathology Final Pathologic Diagnosis  A. LYMPH NODE, LEFT LEVEL 1B, DISSECTION: Benign salivary gland tissue. One out of six lymph nodes, positive for metastatic carcinoma (1/6).  B. LYMPH NODES, LEFT LEVEL 2A, 3, & 4, DISSECTION: Sixteen lymph nodes, negative for metastatic carcinoma (0/16).  C. LYMPH NODES, LEFT LEVEL 2B, DISSECTION: Seven lymph nodes, negative for metastatic carcinoma (0/7).  D. LYMPH NODES, LEFT LEVEL 1A, DISSECTION: One lymph node, negative for metastatic carcinoma (0/1).  E. LYMPH NODES, RIGHT LEVEL 2A, 3, &4, DISSECTION: Eleven lymph nodes, negative for metastatic carcinoma (0/11).  F. LYMPH NODES,  RIGHT LEVEL 2A AND 2B, DISSECTION: Eleven lymph nodes, negative for metastatic carcinoma(0/11).  G. LINGUAL NERVE, RIGHT, EXCISION: Benign nerve tissue. No malignancy identified.  H. LYMPH NODES, RIGHT LEVEL 1B, DISSECTION: Benign salivary gland tissue. Three lymph nodes, negative for metastatic carcinoma. (0/3).  I. LINGUAL NERVE, PROXIMAL LEFT MARGIN, EXCISION: Benign peripheral nerve. No malignancy identified.  J. FLOOR OF MOUTH, LEFT MARGIN, EXCISION: Benign oropharyngeal mucosa. No malignancy identified.  K. SOFT PALATE, LEFT MARGIN, EXCISION: Benign oropharyngeal mucosa. No malignancy identified.  L. PHARYNGEAL MUCOSAL MARGIN, LEFT, EXCISION: Benign oropharyngeal mucosa. No malignancy identified.  M. VALLECULA MUCOSA MARGIN, EXCISION: Benign oropharyngeal mucosa. No malignancy identified.  N. PTERYGOID, DEEP TONSILLAR FOSSA MARGIN, EXCISION: Benign soft tissue. No malignancy identified.  O. TONGUE, TOTAL GLOSSECTOMY: Invasive moderately differentiated squamous cell carcinoma, keratinizing (4.6 cm). Tumor focally present at the deep and left tissue edges, please see separated margin status.  Lymphovascular invasion identified.  Perineural invasion is identified. Pathologic stage: pT4a pN1.  See CAP synoptic report.   Patient was evaluated by radiation oncology at [facility] and plan was to offer adjuvant concurrent chemoradiation.  However patient lives in [place] and prefers to get care locally.    Plan is for concurrent chemoradiation which was delayed due to dental clearance prior to starting radiation treatment.  He also underwent tracheostomy removal, port placement and PEG tube placement at [place] reports doing well.  Denies any shortness of breath.  Tracheostomy is now out.  ECOG PS- 1 Pain scale- 0 Opioid associated constipation- no  Review of systems- Review of Systems  Constitutional: Positive for  malaise/fatigue. Negative for chills, fever and weight loss.  HENT: Negative for congestion, ear  discharge and nosebleeds.   Eyes: Negative for blurred vision.  Respiratory: Negative for cough, hemoptysis, sputum production, shortness of breath and wheezing.   Cardiovascular: Negative for chest pain, palpitations, orthopnea and claudication.  Gastrointestinal: Negative for abdominal pain, blood in stool, constipation, diarrhea, heartburn, melena, nausea and vomiting.  Genitourinary: Negative for dysuria, flank pain, frequency, hematuria and urgency.  Musculoskeletal: Negative for back pain, joint pain and myalgias.  Skin: Negative for rash.  Neurological: Negative for dizziness, tingling, focal weakness, seizures, weakness and headaches.  Endo/Heme/Allergies: Does not bruise/bleed easily.  Psychiatric/Behavioral: Negative for depression and suicidal ideas. The patient does not have insomnia.       Allergies  Allergen Reactions  . Tape     Redness and bleeding with some tapes and adhesives.     Past Medical History:  Diagnosis Date  . Actinic keratosis 03/03/2010   left dorsum hand, bx proven     No past surgical history on file.  Social History   Socioeconomic History  . Marital status: Married    Spouse name: Not on file  . Number of children: Not on file  . Years of education: Not on file  . Highest education level: Not on file  Occupational History  . Not on file  Tobacco Use  . Smoking status: Not on file  . Smokeless tobacco: Not on file  Substance and Sexual Activity  . Alcohol use: Not Currently  . Drug use: Never  . Sexual activity: Not Currently  Other Topics Concern  . Not on file  Social History Narrative  . Not on file   Social Determinants of Health   Financial Resource Strain: Not on file  Food Insecurity: Not on file  Transportation Needs: Not on file  Physical Activity: Not on file  Stress: Not on file  Social Connections: Not on file   Intimate Partner Violence: Not on file    No family history on file.   Current Outpatient Medications:  .  acetaminophen (TYLENOL) 500 MG tablet, Take 2 tablets by mouth every 6 (six) hours as needed., Disp: , Rfl:  .  Ascorbic Acid (VITAMIN C) 1000 MG tablet, Take 1,000 mg by mouth daily., Disp: , Rfl:  .  aspirin EC 81 MG tablet, Take 81 mg by mouth daily. Swallow whole., Disp: , Rfl:  .  enalapril (VASOTEC) 10 MG tablet, Take 10 mg by mouth daily., Disp: , Rfl:  .  glipiZIDE (GLUCOTROL) 10 MG tablet, Take 10 mg by mouth daily before breakfast., Disp: , Rfl:  .  lidocaine-prilocaine (EMLA) cream, Apply 1 application topically as directed. aplly small amount of cream over the port 1 1/2 hours before each treatment, place saran wrap over cream to protect the clothing, Disp: 30 g, Rfl: 2 .  metFORMIN (GLUCOPHAGE) 500 MG tablet, Take 1,000 mg by mouth 2 (two) times daily., Disp: , Rfl:  .  Multiple Vitamin (MULTI-VITAMIN) tablet, Take 1 tablet by mouth daily., Disp: , Rfl:  .  pravastatin (PRAVACHOL) 40 MG tablet, Take 1 tablet by mouth daily., Disp: , Rfl:  .  aspirin 325 MG tablet, Take 1 tablet by mouth in the morning and at bedtime. (Patient not taking: Reported on 10/25/2020), Disp: , Rfl:  No current facility-administered medications for this visit.  Facility-Administered Medications Ordered in Other Visits:  .  heparin lock flush 100 unit/mL, 500 Units, Intravenous, Once, Sindy Guadeloupe, MD .  sodium chloride flush (NS) 0.9 % injection 10 mL, 10 mL, Intravenous, Once, Janese Banks,  Weston Anna, MD  Physical exam:  Vitals:   10/25/20 0900 10/25/20 0906  BP:  97/60  Pulse:  73  Resp:  18  Temp:  97.8 F (36.6 C)  TempSrc:  Tympanic  SpO2:  97%  Weight: 191 lb (86.6 kg)    Physical Exam Constitutional:      Comments: Speech is somewhat garbled due to glossectomy and reconstruction  Eyes:     Extraocular Movements: EOM normal.  Cardiovascular:     Rate and Rhythm: Normal rate and  regular rhythm.     Heart sounds: Normal heart sounds.  Pulmonary:     Effort: Pulmonary effort is normal.     Breath sounds: Normal breath sounds.  Abdominal:     General: Bowel sounds are normal.     Palpations: Abdomen is soft.     Comments: PEG tube in place  Skin:    General: Skin is warm and dry.  Neurological:     Mental Status: He is alert and oriented to person, place, and time.      CMP Latest Ref Rng & Units 07/08/2012  Glucose 65 - 99 mg/dL 161(H)  BUN 7 - 18 mg/dL 10  Creatinine 0.60 - 1.30 mg/dL 0.81  Sodium 136 - 145 mmol/L 143  Potassium 3.5 - 5.1 mmol/L 4.1  Chloride 98 - 107 mmol/L 110(H)  CO2 21 - 32 mmol/L 25  Calcium 8.5 - 10.1 mg/dL 8.0(L)   CBC Latest Ref Rng & Units 10/25/2020  WBC 4.0 - 10.5 K/uL 5.3  Hemoglobin 13.0 - 17.0 g/dL 11.4(L)  Hematocrit 39.0 - 52.0 % 34.3(L)  Platelets 150 - 400 K/uL 209      Assessment and plan- Patient is a 70 y.o. 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  He is here for on treatment assessment prior to cycle 1 of weekly cisplatin chemotherapy  Counts okay to proceed with cycle 1 of weekly cisplatin chemotherapy today.  I will see him back in 1 week's time with CBC with differential CMP and magnesium for cycle 2 of cisplatin chemotherapy.  Encourage patient to keep up with his fluid intake while getting cisplatin.  Again discussed his benefits of cisplatin including all but not limited to nausea, vomiting, low blood counts, risk of infections and hospitalization.  Risk of hearing loss, kidney injury and peripheral neuropathy associated with cisplatin.  Treatment is being given with a curative intent.  Patient understands and agrees to proceed as planned  Patient has a PEG tube in place and is getting tube feeds as well.  I will have him seen by Jennet Maduro from nutrition.  I will also refer him to speech pathology.   Visit  Diagnosis 1. Encounter for antineoplastic chemotherapy   2. Tongue cancer ([place])      Dr. Randa Evens, MD, MPH [facility] at [facility] [phone] 10/25/2020 9:06 AM

## 2020-10-25 NOTE — Addendum Note (Signed)
Addended by: Kern Alberta on: 10/25/2020 01:47 PM   Modules accepted: Orders

## 2020-10-25 NOTE — Progress Notes (Signed)
Pt tolerated infusion well. No s/s of distress or reaction noted. Pt stable at discharge.  

## 2020-10-26 ENCOUNTER — Telehealth: Payer: Self-pay

## 2020-10-26 ENCOUNTER — Ambulatory Visit
Admission: RE | Admit: 2020-10-26 | Discharge: 2020-10-26 | Disposition: A | Discharge status: Home / Self Care | Payer: Medicare HMO | Source: Ambulatory Visit | Attending: Radiation Oncology | Admitting: Radiation Oncology

## 2020-10-26 DIAGNOSIS — Z510 Encounter for antineoplastic radiation therapy: Secondary | ICD-10-CM | POA: Diagnosis not present

## 2020-10-26 LAB — HEPATITIS B SURFACE ANTIBODY, QUANTITATIVE: Hep B S AB Quant (Post): <3.1 m[IU]/mL — ABNORMAL LOW (ref >9.9)

## 2020-10-26 NOTE — Telephone Encounter (Signed)
Telephone call to patient for follow up after receiving first infusion.   No answer but left message stating we were calling to check on them.  Encouraged patient to call for any questions or concerns.   

## 2020-10-27 ENCOUNTER — Ambulatory Visit
Admission: RE | Admit: 2020-10-27 | Discharge: 2020-10-27 | Disposition: A | Discharge status: Home / Self Care | Payer: Medicare HMO | Source: Ambulatory Visit | Attending: Radiation Oncology | Admitting: Radiation Oncology

## 2020-10-27 ENCOUNTER — Encounter: Payer: Self-pay | Admitting: *Deleted

## 2020-10-27 DIAGNOSIS — Z510 Encounter for antineoplastic radiation therapy: Secondary | ICD-10-CM | POA: Diagnosis not present

## 2020-10-28 ENCOUNTER — Ambulatory Visit
Admission: RE | Admit: 2020-10-28 | Discharge: 2020-10-28 | Disposition: A | Discharge status: Home / Self Care | Payer: Medicare HMO | Source: Ambulatory Visit | Attending: Radiation Oncology | Admitting: Radiation Oncology

## 2020-10-28 ENCOUNTER — Ambulatory Visit: Payer: Medicare HMO | Admitting: Dermatology

## 2020-10-28 ENCOUNTER — Other Ambulatory Visit: Payer: Self-pay | Admitting: Oncology

## 2020-10-28 ENCOUNTER — Inpatient Hospital Stay: Payer: Medicare HMO

## 2020-10-28 DIAGNOSIS — D508 Other iron deficiency anemias: Secondary | ICD-10-CM

## 2020-10-28 DIAGNOSIS — D509 Iron deficiency anemia, unspecified: Secondary | ICD-10-CM | POA: Insufficient documentation

## 2020-10-28 DIAGNOSIS — Z510 Encounter for antineoplastic radiation therapy: Secondary | ICD-10-CM | POA: Diagnosis not present

## 2020-10-28 NOTE — Progress Notes (Addendum)
Nutrition Assessment   Reason for Assessment:  Head and neck cancer with feeding tube   ASSESSMENT:  70 year old male with stage IV tongue cancer followed by Dr Janese Banks.  S/p total glossectomy with bilateral neck dissection on 07/15/20 at [facility], HTN and DM.  Patient has had trach removed.  Port and PEG placed at [facility] on 09/22/20.  Previously had dobhoff tube for feeding. Patient receiving radiation and chemotherapy.   Met with patient following radiation.  Patient reports that he is giving 6 cartons of glucerna 1.5 (he thinks) via feeding tube per day.  Tries to give 2 cartons at 5am, 1 1/2 cartons at 10am and 2pm and 1 carton around 7pm.  Gives prostat 17m at 10am feeding and 7pm feeding.  He has been giving about 1938mof water with 1st feeding, 16046mf water with 2nd feeding, 140 ml of water with 3rd feeding and 160m9m water with 4th feeding. Patient reports that he is tolerating glucerna well.  Reports typically has bowel movement about every 2 days sometimes 3 days since being on tube feeding. Has not had BM since Sunday of this week.  Patient is planning to start miralax.    Patient is not taking anything by mouth other than sips of water and ice chips.  Notes reviewed from [facility] and noted recommendations for NPO other than sips of water by teaspoons and ice chips.     Medications: decadron, glipizide, ativan, metformin, MVI, zofran, compazine   Labs: glucose 104   Anthropometrics:   Height: 70 inches Weight: 191 lb 1/24 UBW: 216 lb 07/07/2020 prior to surgery BMI: 27  8 % weight loss in the last 3 months  Estimated Energy Needs  Kcals: 2250-2700 Protein: 112-135 g Fluid: 2.2 L   NUTRITION DIAGNOSIS: Inadequate oral intake related to cancer and cancer related treatment side effects as evidenced by 8% weight loss in the last 3 months and PEG placement     INTERVENTION:  Recommend continue glucerna 1.5, 6 cartons per day and flush (as above).  Recommend adding 240ml15m  free water between feedings for additional hydration.  Written instructions given to patient today and he verbalized understanding.   Tube feeding regimen provides 2336 calories, 147.6 g protein and 2210 ml free water (including from formula) Patient will call medical oncology team if constipation is not relieved with miralax.   Patient receiving enteral nutrition supplies from [employer] and states that he has plenty of supplies.  [facility] is sending him 2 new extension sets as current one has hole in it.  He has it ducked taped and is still able to feed.   Encouraged patient to bring tube feeding and supplies with him on infusion days so that he does not miss getting tube feeding. Contact information given to patient.  Patient planning to meet with SLP here at [facility]/2   MONITORING, EVALUATION, GOAL: weight trends, tube feeding   Next Visit: Thursday, Feb 10th after radiation  Alixandra Alfieri B. AllenZenia Resides LTrenton RHighwoodstered Dietitian [phone])

## 2020-10-29 ENCOUNTER — Ambulatory Visit
Admission: RE | Admit: 2020-10-29 | Discharge: 2020-10-29 | Disposition: A | Discharge status: Home / Self Care | Payer: Medicare HMO | Source: Ambulatory Visit | Attending: Radiation Oncology | Admitting: Radiation Oncology

## 2020-10-29 DIAGNOSIS — Z510 Encounter for antineoplastic radiation therapy: Secondary | ICD-10-CM | POA: Diagnosis not present

## 2020-11-01 ENCOUNTER — Ambulatory Visit
Admission: RE | Admit: 2020-11-01 | Discharge: 2020-11-01 | Disposition: A | Discharge status: Home / Self Care | Payer: Medicare HMO | Source: Ambulatory Visit | Attending: Radiation Oncology | Admitting: Radiation Oncology

## 2020-11-01 DIAGNOSIS — Z510 Encounter for antineoplastic radiation therapy: Secondary | ICD-10-CM | POA: Diagnosis not present

## 2020-11-02 ENCOUNTER — Inpatient Hospital Stay: Payer: Medicare HMO | Attending: Oncology

## 2020-11-02 ENCOUNTER — Ambulatory Visit
Admission: RE | Admit: 2020-11-02 | Discharge: 2020-11-02 | Disposition: A | Discharge status: Home / Self Care | Payer: Medicare HMO | Source: Ambulatory Visit | Attending: Radiation Oncology | Admitting: Radiation Oncology

## 2020-11-02 ENCOUNTER — Inpatient Hospital Stay (HOSPITAL_BASED_OUTPATIENT_CLINIC_OR_DEPARTMENT_OTHER): Payer: Medicare HMO | Admitting: Oncology

## 2020-11-02 ENCOUNTER — Inpatient Hospital Stay: Payer: Medicare HMO

## 2020-11-02 VITALS — BP 108/61 | HR 63 | Temp 96.3°F | Resp 16 | Wt 194.4 lb

## 2020-11-02 DIAGNOSIS — C029 Malignant neoplasm of tongue, unspecified: Secondary | ICD-10-CM | POA: Insufficient documentation

## 2020-11-02 DIAGNOSIS — Z510 Encounter for antineoplastic radiation therapy: Secondary | ICD-10-CM | POA: Diagnosis present

## 2020-11-02 DIAGNOSIS — D509 Iron deficiency anemia, unspecified: Secondary | ICD-10-CM | POA: Diagnosis not present

## 2020-11-02 DIAGNOSIS — C778 Secondary and unspecified malignant neoplasm of lymph nodes of multiple regions: Secondary | ICD-10-CM | POA: Insufficient documentation

## 2020-11-02 DIAGNOSIS — D508 Other iron deficiency anemias: Secondary | ICD-10-CM

## 2020-11-02 DIAGNOSIS — Z5111 Encounter for antineoplastic chemotherapy: Secondary | ICD-10-CM | POA: Diagnosis present

## 2020-11-02 LAB — CBC WITH DIFFERENTIAL/PLATELET
Abs Immature Granulocytes: 0.03 10*3/uL (ref 0.00–0.07)
Basophils Absolute: 0 10*3/uL (ref 0.0–0.1)
Basophils Relative: 0 %
Eosinophils Absolute: 0.1 10*3/uL (ref 0.0–0.5)
Eosinophils Relative: 2 %
HCT: 33.6 % — ABNORMAL LOW (ref 39.0–52.0)
Hemoglobin: 11 g/dL — ABNORMAL LOW (ref 13.0–17.0)
Immature Granulocytes: 0 %
Lymphocytes Relative: 16 %
Lymphs Abs: 1.2 10*3/uL (ref 0.7–4.0)
MCH: 28.5 pg (ref 26.0–34.0)
MCHC: 32.7 g/dL (ref 30.0–36.0)
MCV: 87 fL (ref 80.0–100.0)
Monocytes Absolute: 0.6 10*3/uL (ref 0.1–1.0)
Monocytes Relative: 8 %
Neutro Abs: 5.4 10*3/uL (ref 1.7–7.7)
Neutrophils Relative %: 74 %
Platelets: 280 10*3/uL (ref 150–400)
RBC: 3.86 MIL/uL — ABNORMAL LOW (ref 4.22–5.81)
RDW: 14.7 % (ref 11.5–15.5)
WBC: 7.3 10*3/uL (ref 4.0–10.5)
nRBC: 0 % (ref 0.0–0.2)

## 2020-11-02 LAB — BASIC METABOLIC PANEL
Anion gap: 11 (ref 5–15)
BUN: 26 mg/dL — ABNORMAL HIGH (ref 8–23)
CO2: 26 mmol/L (ref 22–32)
Calcium: 9 mg/dL (ref 8.9–10.3)
Chloride: 102 mmol/L (ref 98–111)
Creatinine, Ser: 0.73 mg/dL (ref 0.61–1.24)
GFR, Estimated: >60 mL/min (ref >60)
Glucose, Bld: 60 mg/dL — ABNORMAL LOW (ref 70–99)
Potassium: 4.4 mmol/L (ref 3.5–5.1)
Sodium: 139 mmol/L (ref 135–145)

## 2020-11-02 LAB — MAGNESIUM: Magnesium: 2.4 mg/dL (ref 1.7–2.4)

## 2020-11-02 MED ORDER — SODIUM CHLORIDE 0.9 % IV SOLN
150.0000 mg | Freq: Once | INTRAVENOUS | Status: AC
  Administered 2020-11-02: 150 mg via INTRAVENOUS
  Filled 2020-11-02: qty 150

## 2020-11-02 MED ORDER — SODIUM CHLORIDE 0.9 % IV SOLN
10.0000 mg | Freq: Once | INTRAVENOUS | Status: AC
  Administered 2020-11-02: 10 mg via INTRAVENOUS
  Filled 2020-11-02: qty 10

## 2020-11-02 MED ORDER — MAGNESIUM SULFATE 2 GM/50ML IV SOLN
2.0000 g | Freq: Once | INTRAVENOUS | Status: AC
  Administered 2020-11-02: 2 g via INTRAVENOUS
  Filled 2020-11-02: qty 50

## 2020-11-02 MED ORDER — PALONOSETRON HCL INJECTION 0.25 MG/5ML
0.2500 mg | Freq: Once | INTRAVENOUS | Status: AC
  Administered 2020-11-02: 0.25 mg via INTRAVENOUS
  Filled 2020-11-02: qty 5

## 2020-11-02 MED ORDER — POTASSIUM CHLORIDE IN NACL 20-0.9 MEQ/L-% IV SOLN
Freq: Once | INTRAVENOUS | Status: AC
  Filled 2020-11-02: qty 1000

## 2020-11-02 MED ORDER — SODIUM CHLORIDE 0.9% FLUSH
10.0000 mL | INTRAVENOUS | Status: DC | PRN
  Administered 2020-11-02: 10 mL via INTRAVENOUS
  Filled 2020-11-02: qty 10

## 2020-11-02 MED ORDER — SODIUM CHLORIDE 0.9 % IV SOLN: 200.0000 mg | INTRAVENOUS | Status: DC

## 2020-11-02 MED ORDER — SODIUM CHLORIDE 0.9 % IV SOLN
40.0000 mg/m2 | Freq: Once | INTRAVENOUS | Status: AC
  Administered 2020-11-02: 84 mg via INTRAVENOUS
  Filled 2020-11-02: qty 84

## 2020-11-02 MED ORDER — SODIUM CHLORIDE 0.9 % IV SOLN
Freq: Once | INTRAVENOUS | Status: AC
  Filled 2020-11-02: qty 250

## 2020-11-02 MED ORDER — IRON SUCROSE 20 MG/ML IV SOLN
200.0000 mg | Freq: Once | INTRAVENOUS | Status: AC
  Administered 2020-11-02: 200 mg via INTRAVENOUS
  Filled 2020-11-02: qty 10

## 2020-11-02 MED ORDER — HEPARIN SOD (PORK) LOCK FLUSH 100 UNIT/ML IV SOLN
500.0000 [IU] | Freq: Once | INTRAVENOUS | Status: DC | PRN
  Filled 2020-11-02: qty 5

## 2020-11-02 MED ORDER — HEPARIN SOD (PORK) LOCK FLUSH 100 UNIT/ML IV SOLN
500.0000 [IU] | Freq: Once | INTRAVENOUS | Status: AC
  Administered 2020-11-02: 500 [IU] via INTRAVENOUS
  Filled 2020-11-02: qty 5

## 2020-11-02 MED ORDER — HEPARIN SOD (PORK) LOCK FLUSH 100 UNIT/ML IV SOLN
INTRAVENOUS | Status: AC
  Filled 2020-11-02: qty 5

## 2020-11-02 NOTE — Progress Notes (Signed)
Stable at discharge 

## 2020-11-02 NOTE — Progress Notes (Addendum)
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 Conley Canal for a lesion that he identified on his left tongue. Biopsy on 06/15/2020 was consistent with squamous cell carcinoma. This was followed by a CT neck and chest. CT neck showed a large poorly defined infiltrative mass throughout the posterior left oral tongue and left oral base extending to the left glossotonsillar sulcus. The mass crosses midline through the lingual septum into the right hemitongue. Findings are concerning for left lateral retropharyngeal as well as level 1B283 and 4 nodal metastases. CT chest without contrast did not show any evidence of metastatic disease. Patient then underwent total glossectomy with bilateral neck dissection on 07/15/2020.  Pathology showed:Surgical Pathology Final Pathologic Diagnosis  A. LYMPH NODE, LEFT LEVEL 1B, DISSECTION: Benign salivary gland tissue. One out of six lymph nodes, positive for metastatic carcinoma (1/6).  B. LYMPH NODES, LEFT LEVEL 2A, 3, &4, DISSECTION: Sixteen lymph nodes, negative for metastatic carcinoma (0/16).  C. LYMPH NODES, LEFT LEVEL 2B, DISSECTION: Seven lymph nodes, negative for metastatic carcinoma (0/7).  D. LYMPH NODES, LEFT LEVEL 1A, DISSECTION: One lymph node, negative for metastatic carcinoma (0/1).  E. LYMPH NODES, RIGHT LEVEL 2A, 3, &4, DISSECTION: Eleven lymph nodes, negative for metastatic carcinoma (0/11).  F. LYMPH NODES,  RIGHT LEVEL 2A AND 2B, DISSECTION: Eleven lymph nodes, negative for metastatic carcinoma(0/11).  G. LINGUAL NERVE, RIGHT, EXCISION: Benign nerve tissue. No malignancy identified.  H. LYMPH NODES, RIGHT LEVEL 1B, DISSECTION: Benign salivary gland tissue. Three lymph nodes, negative for metastatic carcinoma. (0/3).  I. LINGUAL NERVE, PROXIMAL LEFT MARGIN, EXCISION: Benign peripheral nerve. No malignancy identified.  J. FLOOR OF MOUTH, LEFT MARGIN, EXCISION: Benign oropharyngeal mucosa. No malignancy identified.  K. SOFT PALATE, LEFT MARGIN, EXCISION: Benign oropharyngeal mucosa. No malignancy identified.  L. PHARYNGEAL MUCOSAL MARGIN, LEFT, EXCISION: Benign oropharyngeal mucosa. No malignancy identified.  M. VALLECULA MUCOSA MARGIN, EXCISION: Benign oropharyngeal mucosa. No malignancy identified.  N. PTERYGOID, DEEP TONSILLAR FOSSA MARGIN, EXCISION: Benign soft tissue. No malignancy identified.  O. TONGUE, TOTAL GLOSSECTOMY: Invasive moderately differentiated squamous cell carcinoma, keratinizing (4.6 cm). Tumor focally present at the deep and left tissue edges, please see separated margin status.  Lymphovascular invasion identified.  Perineural invasion is identified. Pathologic stage: pT4a pN1.  See CAP synoptic report.   Patient was evaluated by radiation oncology at [facility] and plan was to offer adjuvant concurrent chemoradiation. However patient lives in [place] and prefers to get care locally.   Plan is for concurrent chemoradiation which was delayed due to dental clearance prior to starting radiation treatment.  He also underwent tracheostomy removal, port placement and PEG tube placement at [place] history-reports doing well overall.  Denies any significant pain.  He is using 6 Glucerna as a day.  Reports no problems with PEG tube.  Bowel movements are regular  ECOG PS- 1 Pain scale- 0   Review of systems- Review of Systems   Constitutional: Positive for malaise/fatigue. Negative for chills, fever and weight loss.  HENT: Negative for  congestion, ear discharge and nosebleeds.   Eyes: Negative for blurred vision.  Respiratory: Negative for cough, hemoptysis, sputum production, shortness of breath and wheezing.   Cardiovascular: Negative for chest pain, palpitations, orthopnea and claudication.  Gastrointestinal: Negative for abdominal pain, blood in stool, constipation, diarrhea, heartburn, melena, nausea and vomiting.  Genitourinary: Negative for dysuria, flank pain, frequency, hematuria and urgency.  Musculoskeletal: Negative for back pain, joint pain and myalgias.  Skin: Negative for rash.  Neurological: Negative for dizziness, tingling, focal weakness, seizures, weakness and headaches.  Endo/Heme/Allergies: Does not bruise/bleed easily.  Psychiatric/Behavioral: Negative for depression and suicidal ideas. The patient does not have insomnia.       Allergies  Allergen Reactions  . Tape     Redness and bleeding with some tapes and adhesives.     Past Medical History:  Diagnosis Date  . Actinic keratosis 03/03/2010   left dorsum hand, bx proven     No past surgical history on file.  Social History   Socioeconomic History  . Marital status: Married    Spouse name: Not on file  . Number of children: Not on file  . Years of education: Not on file  . Highest education level: Not on file  Occupational History  . Not on file  Tobacco Use  . Smoking status: Not on file  . Smokeless tobacco: Not on file  Substance and Sexual Activity  . Alcohol use: Not Currently  . Drug use: Never  . Sexual activity: Not Currently  Other Topics Concern  . Not on file  Social History Narrative  . Not on file   Social Determinants of Health   Financial Resource Strain: Not on file  Food Insecurity: Not on file  Transportation Needs: Not on file  Physical Activity: Not on file  Stress: Not on file  Social  Connections: Not on file  Intimate Partner Violence: Not on file    No family history on file.   Current Outpatient Medications:  .  acetaminophen (TYLENOL) 500 MG tablet, Take 2 tablets by mouth every 6 (six) hours as needed., Disp: , Rfl:  .  Ascorbic Acid (VITAMIN C) 1000 MG tablet, Take 1,000 mg by mouth daily., Disp: , Rfl:  .  aspirin 325 MG tablet, Take 1 tablet by mouth in the morning and at bedtime. (Patient not taking: Reported on 10/25/2020), Disp: , Rfl:  .  aspirin EC 81 MG tablet, Take 81 mg by mouth daily. Swallow whole., Disp: , Rfl:  .  dexamethasone (DECADRON) 4 MG tablet, Take 1 tablet (4 mg total) by mouth 2 (two) times daily with a meal. Take 2 tablets Daily for 3 days, starting the day After cisplatin chemotherapy, Disp: 30 tablet, Rfl: 1 .  enalapril (VASOTEC) 10 MG tablet, Take 10 mg by mouth daily., Disp: , Rfl:  .  glipiZIDE (GLUCOTROL) 10 MG tablet, Take 10 mg by mouth daily before breakfast., Disp: , Rfl:  .  lidocaine-prilocaine (EMLA) cream, Apply 1 application topically as directed. aplly small amount of cream over the port 1 1/2 hours before each treatment, place saran wrap over cream to protect the clothing, Disp: 30 g, Rfl: 2 .  LORazepam (ATIVAN) 0.5 MG tablet, Take 1 tablet (0.5 mg total) by mouth every 8 (eight) hours as needed for anxiety., Disp: 30 tablet, Rfl: 0 .  metFORMIN (GLUCOPHAGE) 500 MG tablet, Take 1,000 mg by mouth 2 (two) times daily., Disp: , Rfl:  .  Multiple Vitamin (MULTI-VITAMIN) tablet, Take 1 tablet  by mouth daily., Disp: , Rfl:  .  ondansetron (ZOFRAN) 8 MG tablet, Take 1 tablet (8 mg total) by mouth every 8 (eight) hours as needed for nausea or vomiting. Start on the 3rd day after chemotherapy, Disp: 20 tablet, Rfl: 0 .  pravastatin (PRAVACHOL) 40 MG tablet, Take 1 tablet by mouth daily., Disp: , Rfl:  .  prochlorperazine (COMPAZINE) 10 MG tablet, Take 1 tablet (10 mg total) by mouth every 6 (six) hours as needed for nausea or vomiting.,  Disp: 30 tablet, Rfl: 0 No current facility-administered medications for this visit.  Facility-Administered Medications Ordered in Other Visits:  .  heparin lock flush 100 unit/mL, 500 Units, Intravenous, Once, Sindy Guadeloupe, MD .  sodium chloride flush (NS) 0.9 % injection 10 mL, 10 mL, Intravenous, PRN, Sindy Guadeloupe, MD  Physical exam:  Vitals:   11/02/20 0852  BP: 108/61  Pulse: 63  Resp: 16  Temp: (!) 96.3 F (35.7 C)  TempSrc: Tympanic  SpO2: 99%  Weight: 194 lb 6.4 oz (88.2 kg)   Physical Exam HENT:     Mouth/Throat:     Mouth: Mucous membranes are moist.     Pharynx: Oropharynx is clear.     Comments: Surgical changes of recent glossectomy and reconstruction.  No evidence of mucositis Eyes:     Extraocular Movements: EOM normal.  Cardiovascular:     Rate and Rhythm: Normal rate and regular rhythm.     Heart sounds: Normal heart sounds.  Pulmonary:     Effort: Pulmonary effort is normal.     Breath sounds: Normal breath sounds.  Abdominal:     General: Bowel sounds are normal.     Palpations: Abdomen is soft.     Comments: PEG tube in place  Musculoskeletal:     Cervical back: Normal range of motion.  Skin:    General: Skin is warm and dry.  Neurological:     Mental Status: He is alert and oriented to person, place, and time.      CMP Latest Ref Rng & Units 11/02/2020  Glucose 70 - 99 mg/dL 60(L)  BUN 8 - 23 mg/dL 26(H)  Creatinine 0.61 - 1.24 mg/dL 0.73  Sodium 135 - 145 mmol/L 139  Potassium 3.5 - 5.1 mmol/L 4.4  Chloride 98 - 111 mmol/L 102  CO2 22 - 32 mmol/L 26  Calcium 8.9 - 10.3 mg/dL 9.0   CBC Latest Ref Rng & Units 11/02/2020  WBC 4.0 - 10.5 K/uL 7.3  Hemoglobin 13.0 - 17.0 g/dL 11.0(L)  Hematocrit 39.0 - 52.0 % 33.6(L)  Platelets 150 - 400 K/uL 280      Assessment and plan- Patient is a 70 y.o. 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   He is  here for on treatment assessment prior to cycle 2 of weekly cisplatin chemotherapy  Counts okay to proceed with cycle 2 of weekly cisplatin chemotherapy today.  She will directly proceed for cycle 3 next week and I will see him back in 2 weeks for cycle 4.  Patient is following up with nutrition and also has a consult placed for speech pathology.  He is using 6 Glucerna a day.  Normocytic anemia: Labs suggestive of iron deficiency.  He will receive 5 doses of Venofer with every dose of cisplatin starting today    Visit Diagnosis 1. Encounter for antineoplastic chemotherapy   2. Tongue cancer ([place])  3. Other iron deficiency anemia      Dr. Randa Evens, MD, MPH [facility] [phone] 11/02/2020 9:15 AM

## 2020-11-03 ENCOUNTER — Ambulatory Visit: Payer: Medicare HMO | Attending: Radiation Oncology | Admitting: Speech Pathology

## 2020-11-03 ENCOUNTER — Other Ambulatory Visit: Payer: Self-pay

## 2020-11-03 ENCOUNTER — Other Ambulatory Visit: Payer: Self-pay | Admitting: *Deleted

## 2020-11-03 ENCOUNTER — Ambulatory Visit
Admission: RE | Admit: 2020-11-03 | Discharge: 2020-11-03 | Disposition: A | Discharge status: Home / Self Care | Payer: Medicare HMO | Source: Ambulatory Visit | Attending: Radiation Oncology | Admitting: Radiation Oncology

## 2020-11-03 DIAGNOSIS — F800 Phonological disorder: Secondary | ICD-10-CM | POA: Insufficient documentation

## 2020-11-03 DIAGNOSIS — R471 Dysarthria and anarthria: Secondary | ICD-10-CM | POA: Insufficient documentation

## 2020-11-03 DIAGNOSIS — Z9049 Acquired absence of other specified parts of digestive tract: Secondary | ICD-10-CM | POA: Diagnosis present

## 2020-11-03 DIAGNOSIS — R1312 Dysphagia, oropharyngeal phase: Secondary | ICD-10-CM | POA: Insufficient documentation

## 2020-11-03 DIAGNOSIS — C029 Malignant neoplasm of tongue, unspecified: Secondary | ICD-10-CM | POA: Diagnosis present

## 2020-11-03 DIAGNOSIS — Z510 Encounter for antineoplastic radiation therapy: Secondary | ICD-10-CM | POA: Diagnosis not present

## 2020-11-03 MED ORDER — SUCRALFATE 1 G PO TABS: 0.5000 g | ORAL_TABLET | Freq: Three times a day (TID) | ORAL | 1 refills | Status: DC

## 2020-11-03 NOTE — Patient Instructions (Signed)
Begin searching apps on his smart phone for text to talk possibilities

## 2020-11-03 NOTE — Therapy (Signed)
[place] MAIN [facility] SERVICES [address] [place], [place], [address] Phone: [phone]   Fax:  [phone]  Speech Language Pathology Evaluation  Patient Details  Name: Ronnie Buckley MRN: [id number] Date of Birth: 1951/03/03 Referring Provider (SLP): Noreene Filbert   Encounter Date: 11/03/2020   End of Session - 11/03/20 1645    Visit Number 1    Number of Visits 13    Date for SLP Re-Evaluation 12/15/20    Authorization Type [employer] Medicare    Authorization Time Period 11/03/2020 thru 12/15/2020    Authorization - Visit Number 1    Progress Note Due on Visit 10    SLP Start Time 0900    SLP Stop Time  0930    SLP Time Calculation (min) 30 min    Activity Tolerance Patient tolerated treatment well           Past Medical History:  Diagnosis Date  . Actinic keratosis 03/03/2010   left dorsum hand, bx proven    No past surgical history on file.  There were no vitals filed for this visit.   Subjective Assessment - 11/03/20 1629    Subjective pt pleasant, good historian    Currently in Pain? No/denies              SLP Evaluation [facility] - 11/03/20 1629      SLP Visit Information   SLP Received On 11/03/20    Referring Provider (SLP) Noreene Filbert    Onset Date 07/30/2020    Medical Diagnosis Tongue Cancer      Subjective   Patient/Family Stated Goal Pt would like to increase his speech intelligibility and advance his diet to include puree and thin liquids      General Information   HPI Pt is s/p an ALT free flap reconstruction of total glossectomy on 07/30/20 d/t squamous cell carcinoma of the oral tongue. In addition to pt's total glossectomy, he also had left modified radical neck dissection, right modified radical neck dissection and left partial pharyngectomy. Recent Flexible Endoscopic Evaluation of Swallowing (FEES) was conducted on 10/13/2020. At that time, pt demonstrated a severe oropharyngeal dysphagia and it  was recommended that patient continue nutrition/hydration/medication through the G-tube, with teaspoon sips of water and/or ice chips by mouth following meticulous oral care. Effortful Swallow and [employer] were introduced. Pt is currently undergoing chemoRT at [facility].    Behavioral/Cognition appropriate and intact    Mobility Status amublatory      Balance Screen   Has the patient fallen in the past 6 months No    Has the patient had a decrease in activity level because of a fear of falling?  No    Is the patient reluctant to leave their home because of a fear of falling?  No      Prior Functional Status   Cognitive/Linguistic Baseline Within functional limits    Type of Home House      Pain Assessment   Pain Assessment No/denies pain      Cognition   Overall Cognitive Status Within Functional Limits for tasks assessed      Auditory Comprehension   Overall Auditory Comprehension Appears within functional limits for tasks assessed      Visual Recognition/Discrimination   Discrimination Within Function Limits      Reading Comprehension   Reading Status Within funtional limits      Expression   Primary Mode of Expression Verbal   uses low  tech white board     Verbal Expression   Overall Verbal Expression Impaired      Written Expression   Dominant Hand Right    Written Expression Within Functional Limits      Oral Motor/Sensory Function   Overall Oral Motor/Sensory Function Impaired    Labial ROM Within Functional Limits    Labial Symmetry Within Functional Limits    Labial Strength Within Functional Limits    Labial Sensation Within Functional Limits    Labial Coordination WFL    Lingual ROM --   n/a ATL free flap is attached to floor of his mouth   Lingual Symmetry --   N/A   Lingual Strength --   N/A   Lingual Sensation --   N/A   Lingual Coordination --   N/A   [place]um --   impaired   Mandible Impaired      Motor Speech   Overall Motor  Speech Impaired    Respiration Within functional limits    Phonation Breathy;Low vocal intensity    Resonance Hypernasality    Articulation Impaired    Level of Impairment Word    Intelligibility Intelligibility reduced    Word 0-24% accurate    Phrase 0-24% accurate    Sentence 0-24% accurate            SLP Education - 11/03/20 1645    Education Details ST POC    Person(s) Educated Patient    Methods Explanation;Verbal cues    Comprehension Verbalized understanding;Returned demonstration;Need further instruction              SLP Long Term Goals - 11/03/20 1706      SLP LONG TERM GOAL #1   Title Pt will utilize multi-modal forms of communication to convey basic wants/needs in 75% of opportunities with various communication partners.    Baseline <25%    Time 6    Period Weeks    Status New    Target Date 12/15/20            Plan - 11/03/20 1646    Clinical Impression Statement Pt presents with severe deficits in speech intelligibility that result in < 50% intelligibility across all utterances. Pt had a low-tech white board with him that he used to supplement his speech. Pt is s/p ALT free flap reconstruction of total glossectomy and as such his ALT free flap appears fully attached to the floor of his mouth preventing any ability to achieve articulatory positions. Pt's speech is c/b deficits in resonance d/t left partial pharyngectomy. Pt's oropharyngeal abilities are also severely impaired by his reconstructive surgeries. Orally, pt lacks any physical ability to manipulate boluses and he lacks all sensation to the ALT free flap. Pt provides that he is unable to feel sensation of ice chip on the free flap but can feel the presence of the ice chip in his buccal cavities. It would appear that pt's prognosis for advancing to purees is extremely poor. During this evaluation, pt's voice was continually wet with constant throat clears. I would suspect that pt's pharyngeal phase  continues to be severely impaired and pt is aware that chemoRT will likely hamper pharyngeal recovery. Will continue working on the [employer] and [employer] as recommended by SLP at [place]. Will defer further trials of POs to his follow up visit with his treatment team at [place]. At this time, skilled ST is required to provide an alternate means of communicate for pt as well as  facilitate pharyngeal strength.    Speech Therapy Frequency 2x / week    Duration 12 weeks    Treatment/Interventions Pharyngeal strengthening exercises;Internal/external aids;Compensatory techniques;SLP instruction and feedback;Patient/family education    Potential to Achieve Goals Good    Potential Considerations Severity of impairments    SLP Home Exercise Plan investigate text to talk apps    Consulted and Agree with Plan of Care Patient           Patient will benefit from skilled therapeutic intervention in order to improve the following deficits and impairments:   Dysarthria and anarthria  S/P glossectomy  Oropharyngeal dysphagia  Tongue cancer [facility])    Problem List Patient Active Problem List   Diagnosis Date Noted  . Iron deficiency anemia 10/28/2020  . Goals of care, counseling/discussion 09/08/2020  . Open wound of leg without complication, right, sequela 08/23/2020  . Oropharyngeal dysphagia 08/23/2020  . S/P glossectomy 08/23/2020  . 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 Hypercholesterolemia 08/02/2020  . Hypertension 08/02/2020  . Tongue cancer ([place]) 07/08/2020  . Type 2 diabetes mellitus with hyperglycemia, without long-term current use of insulin ([place]) 10/24/2018  . Primary osteoarthritis of both knees 12/02/2015   Rowen Wilmer B. Rutherford Nail M.S., CCC-SLP, [place] Pathologist Rehabilitation Services Office [phone]  Stormy Fabian 11/03/2020, 5:09 PM  [place] MAIN [facility] SERVICES [address]  [place], [place], [address] Phone: [phone]   Fax:  [phone]  Name: CHRISTOHER SIA MRN: [id number] Date of Birth: [birth year]

## 2020-11-04 ENCOUNTER — Ambulatory Visit
Admission: RE | Admit: 2020-11-04 | Discharge: 2020-11-04 | Disposition: A | Discharge status: Home / Self Care | Payer: Medicare HMO | Source: Ambulatory Visit | Attending: Radiation Oncology | Admitting: Radiation Oncology

## 2020-11-04 DIAGNOSIS — Z510 Encounter for antineoplastic radiation therapy: Secondary | ICD-10-CM | POA: Diagnosis not present

## 2020-11-05 ENCOUNTER — Ambulatory Visit
Admission: RE | Admit: 2020-11-05 | Discharge: 2020-11-05 | Disposition: A | Discharge status: Home / Self Care | Payer: Medicare HMO | Source: Ambulatory Visit | Attending: Radiation Oncology | Admitting: Radiation Oncology

## 2020-11-05 DIAGNOSIS — Z510 Encounter for antineoplastic radiation therapy: Secondary | ICD-10-CM | POA: Diagnosis not present

## 2020-11-08 ENCOUNTER — Ambulatory Visit
Admission: RE | Admit: 2020-11-08 | Discharge: 2020-11-08 | Disposition: A | Discharge status: Home / Self Care | Payer: Medicare HMO | Source: Ambulatory Visit | Attending: Radiation Oncology | Admitting: Radiation Oncology

## 2020-11-08 DIAGNOSIS — Z510 Encounter for antineoplastic radiation therapy: Secondary | ICD-10-CM | POA: Diagnosis not present

## 2020-11-09 ENCOUNTER — Ambulatory Visit: Payer: Medicare HMO | Admitting: Oncology

## 2020-11-09 ENCOUNTER — Inpatient Hospital Stay: Payer: Medicare HMO

## 2020-11-09 ENCOUNTER — Ambulatory Visit
Admission: RE | Admit: 2020-11-09 | Discharge: 2020-11-09 | Disposition: A | Discharge status: Home / Self Care | Payer: Medicare HMO | Source: Ambulatory Visit | Attending: Radiation Oncology | Admitting: Radiation Oncology

## 2020-11-09 VITALS — BP 107/60 | HR 58 | Temp 96.0°F | Resp 16 | Wt 196.2 lb

## 2020-11-09 DIAGNOSIS — C029 Malignant neoplasm of tongue, unspecified: Secondary | ICD-10-CM

## 2020-11-09 DIAGNOSIS — Z5111 Encounter for antineoplastic chemotherapy: Secondary | ICD-10-CM | POA: Diagnosis not present

## 2020-11-09 DIAGNOSIS — Z510 Encounter for antineoplastic radiation therapy: Secondary | ICD-10-CM | POA: Diagnosis not present

## 2020-11-09 LAB — CBC WITH DIFFERENTIAL/PLATELET
Abs Immature Granulocytes: 0.01 10*3/uL (ref 0.00–0.07)
Basophils Absolute: 0 10*3/uL (ref 0.0–0.1)
Basophils Relative: 0 %
Eosinophils Absolute: 0.1 10*3/uL (ref 0.0–0.5)
Eosinophils Relative: 1 %
HCT: 30.7 % — ABNORMAL LOW (ref 39.0–52.0)
Hemoglobin: 10.4 g/dL — ABNORMAL LOW (ref 13.0–17.0)
Immature Granulocytes: 0 %
Lymphocytes Relative: 14 %
Lymphs Abs: 0.8 10*3/uL (ref 0.7–4.0)
MCH: 29.1 pg (ref 26.0–34.0)
MCHC: 33.9 g/dL (ref 30.0–36.0)
MCV: 85.8 fL (ref 80.0–100.0)
Monocytes Absolute: 0.4 10*3/uL (ref 0.1–1.0)
Monocytes Relative: 7 %
Neutro Abs: 4.4 10*3/uL (ref 1.7–7.7)
Neutrophils Relative %: 78 %
Platelets: 253 10*3/uL (ref 150–400)
RBC: 3.58 MIL/uL — ABNORMAL LOW (ref 4.22–5.81)
RDW: 14.8 % (ref 11.5–15.5)
WBC: 5.6 10*3/uL (ref 4.0–10.5)
nRBC: 0 % (ref 0.0–0.2)

## 2020-11-09 LAB — BASIC METABOLIC PANEL
Anion gap: 9 (ref 5–15)
BUN: 27 mg/dL — ABNORMAL HIGH (ref 8–23)
CO2: 27 mmol/L (ref 22–32)
Calcium: 8.7 mg/dL — ABNORMAL LOW (ref 8.9–10.3)
Chloride: 102 mmol/L (ref 98–111)
Creatinine, Ser: 0.59 mg/dL — ABNORMAL LOW (ref 0.61–1.24)
GFR, Estimated: >60 mL/min (ref >60)
Glucose, Bld: 74 mg/dL (ref 70–99)
Potassium: 4.2 mmol/L (ref 3.5–5.1)
Sodium: 138 mmol/L (ref 135–145)

## 2020-11-09 LAB — MAGNESIUM: Magnesium: 2.3 mg/dL (ref 1.7–2.4)

## 2020-11-09 MED ORDER — MAGNESIUM SULFATE 2 GM/50ML IV SOLN
2.0000 g | Freq: Once | INTRAVENOUS | Status: AC
  Administered 2020-11-09: 2 g via INTRAVENOUS
  Filled 2020-11-09: qty 50

## 2020-11-09 MED ORDER — PALONOSETRON HCL INJECTION 0.25 MG/5ML
0.2500 mg | Freq: Once | INTRAVENOUS | Status: AC
  Administered 2020-11-09: 0.25 mg via INTRAVENOUS
  Filled 2020-11-09: qty 5

## 2020-11-09 MED ORDER — HEPARIN SOD (PORK) LOCK FLUSH 100 UNIT/ML IV SOLN
INTRAVENOUS | Status: AC
  Filled 2020-11-09: qty 5

## 2020-11-09 MED ORDER — SODIUM CHLORIDE 0.9 % IV SOLN
150.0000 mg | Freq: Once | INTRAVENOUS | Status: AC
  Administered 2020-11-09: 150 mg via INTRAVENOUS
  Filled 2020-11-09: qty 150

## 2020-11-09 MED ORDER — POTASSIUM CHLORIDE IN NACL 20-0.9 MEQ/L-% IV SOLN
Freq: Once | INTRAVENOUS | Status: AC
  Filled 2020-11-09: qty 1000

## 2020-11-09 MED ORDER — SODIUM CHLORIDE 0.9 % IV SOLN
40.0000 mg/m2 | Freq: Once | INTRAVENOUS | Status: AC
  Administered 2020-11-09: 84 mg via INTRAVENOUS
  Filled 2020-11-09: qty 84

## 2020-11-09 MED ORDER — SODIUM CHLORIDE 0.9 % IV SOLN
10.0000 mg | Freq: Once | INTRAVENOUS | Status: AC
  Administered 2020-11-09: 10 mg via INTRAVENOUS
  Filled 2020-11-09: qty 10

## 2020-11-09 MED ORDER — HEPARIN SOD (PORK) LOCK FLUSH 100 UNIT/ML IV SOLN
500.0000 [IU] | Freq: Once | INTRAVENOUS | Status: AC | PRN
  Administered 2020-11-09: 500 [IU]
  Filled 2020-11-09: qty 5

## 2020-11-09 MED ORDER — SODIUM CHLORIDE 0.9 % IV SOLN
Freq: Once | INTRAVENOUS | Status: AC
  Filled 2020-11-09: qty 250

## 2020-11-10 ENCOUNTER — Ambulatory Visit: Payer: Medicare HMO | Admitting: Speech Pathology

## 2020-11-10 ENCOUNTER — Other Ambulatory Visit: Payer: Self-pay

## 2020-11-10 ENCOUNTER — Ambulatory Visit
Admission: RE | Admit: 2020-11-10 | Discharge: 2020-11-10 | Disposition: A | Discharge status: Home / Self Care | Payer: Medicare HMO | Source: Ambulatory Visit | Attending: Radiation Oncology | Admitting: Radiation Oncology

## 2020-11-10 DIAGNOSIS — C029 Malignant neoplasm of tongue, unspecified: Secondary | ICD-10-CM

## 2020-11-10 DIAGNOSIS — R471 Dysarthria and anarthria: Secondary | ICD-10-CM

## 2020-11-10 DIAGNOSIS — Z9049 Acquired absence of other specified parts of digestive tract: Secondary | ICD-10-CM

## 2020-11-10 DIAGNOSIS — R1312 Dysphagia, oropharyngeal phase: Secondary | ICD-10-CM

## 2020-11-10 DIAGNOSIS — Z510 Encounter for antineoplastic radiation therapy: Secondary | ICD-10-CM | POA: Diagnosis not present

## 2020-11-10 NOTE — Patient Instructions (Addendum)
1. Create a list of common phrases or sentences that you use that can be added to word bank on Text to Speech! App.  2. Continue pharyngeal strengthening exercises (Mendehlson Maneuver & Effortful Swallow); see Medbridge exercises for reference

## 2020-11-10 NOTE — Therapy (Addendum)
[place] MAIN [facility] SERVICES [address] [place], [place], [address] Phone: [phone]   Fax:  [phone]  Speech Language Pathology Treatment  Patient Details  Name: Ronnie Buckley MRN: [id number] Date of Birth: 1951/04/16 Referring Provider (SLP): Noreene Filbert   Encounter Date: 11/10/2020   End of Session - 11/10/20 1024    Visit Number 2    Number of Visits 13    Date for SLP Re-Evaluation 12/15/20    Authorization Type Aetna Medicare    Authorization Time Period 11/03/2020 thru 12/15/2020    Authorization - Visit Number 2    Progress Note Due on Visit 10    SLP Start Time 0800    SLP Stop Time  0900    SLP Time Calculation (min) 60 min    Activity Tolerance Patient tolerated treatment well           Past Medical History:  Diagnosis Date  . Actinic keratosis 03/03/2010   left dorsum hand, bx proven     Subjective Assessment - 11/10/20 1014    Subjective pt pleasant, aware of speech and swallowing deficits    Currently in Pain? No/denies          ST  CLINIC   TX   NOTE          Treatment Data and Patient's Response to Treatment    Skilled treatment session focused on pt's oropharyngeal dysphagia as well as his communication goals. SLP facilitated session by providing extensive education on anatomy of flap reconstruction, sensation and motility of flap. Sensation and motility of flap will not return and pt's must use a syringe to back of their mouth for any PO intake post total glossectomy. 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 Pt voiced understanding of these concepts as evidenced by his questions. One such question, "I can't even put soup in the back of my mouth with a syringe?" which indicated understanding of pharyngeal impairments and high risk of aspiration. Instructions provided to increase consumption of ice chips and tsp of  water to prevent dryness from radiation and also promote use of pharyngeal muscles. Pt instructed to continue practicing pharyngeal exercises, specifically the [facility] and Effortful Swallow. Med bridge video of each exercise was sent to pt's email.   To increase pt's ability to communicate more effectively than using a dry erase board, SLP introduced "TEXT to Speech!" App for pt's iPad and iphone. App was downloaded onto therapy iPad with pt to bring in his iPad as well as Apple ID for his phone to next session. With moderate assistance, pt was able to generate rote phrases that he is frequently asked at radiation appts etc. These phrases were then entered and selected as favorites by pt. Further practice was facilitated by role playing potential questions at the doctor's office or pharmacy. Pt able to demonstrate basic use of app such as typing and playing messages. He required moderate cues for navigation within the app. As such skilled ST is required to increase pt's accuracy and efficiency with App and to also explore additional apps to increase pt's ability to independently communicate.  Pt voiced appreciation for information related to reconstructed flap and he was extremely excited about have a way to effectively communicate using a text to talk app.       SLP Long Term Goals - 11/03/20 1706      SLP LONG TERM GOAL #  1   Title Pt will utilize multi-modal forms of communication to convey basic wants/needs in 75% of opportunities with various communication partners.    Baseline <25%    Time 6    Period Weeks    Status New    Target Date 12/15/20            Plan - 11/10/20 1229    Speech Therapy Frequency 2x / week    Duration 12 weeks    Treatment/Interventions Pharyngeal strengthening exercises;Internal/external aids;Compensatory techniques;SLP instruction and feedback;Patient/family education    Potential to Achieve Goals Good    Potential Considerations Severity of impairments     SLP Home Exercise Plan investigate text to talk apps    Consulted and Agree with Plan of Care Patient           Patient will benefit from skilled therapeutic intervention in order to improve the following deficits and impairments:    Dysarthria and anarthria  S/P glossectomy    Tongue cancer [facility])    Problem List Patient Active Problem List   Diagnosis Date Noted  . Iron deficiency anemia 10/28/2020  . Goals of care, counseling/discussion 09/08/2020  . Open wound of leg without complication, right, sequela 08/23/2020  . Oropharyngeal dysphagia 08/23/2020  . S/P glossectomy 08/23/2020  . 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 Hypercholesterolemia 08/02/2020  . Hypertension 08/02/2020  . Tongue cancer ([place]) 07/08/2020  . Type 2 diabetes mellitus with hyperglycemia, without long-term current use of insulin ([place]) 10/24/2018  . Primary osteoarthritis of both knees 12/02/2015      Roshard Rezabek B. Rutherford Nail M.S., CCC-SLP, [place] Office [phone]  Stormy Fabian 11/10/2020, 12:30 PM  [place] MAIN [facility] SERVICES [address] [place], [place], [address] Phone: [phone]   Fax:  [phone]   Name: Ronnie Buckley MRN: [id number] Date of Birth: 18-Nov-1950

## 2020-11-11 ENCOUNTER — Inpatient Hospital Stay: Payer: Medicare HMO

## 2020-11-11 ENCOUNTER — Ambulatory Visit
Admission: RE | Admit: 2020-11-11 | Discharge: 2020-11-11 | Disposition: A | Discharge status: Home / Self Care | Payer: Medicare HMO | Source: Ambulatory Visit | Attending: Radiation Oncology | Admitting: Radiation Oncology

## 2020-11-11 DIAGNOSIS — Z510 Encounter for antineoplastic radiation therapy: Secondary | ICD-10-CM | POA: Diagnosis not present

## 2020-11-11 NOTE — Progress Notes (Signed)
Nutrition Follow-up:  Patient with stage IV tongue cancer followed by Dr. Janese Banks.  S/p total glossectomy with bilateral neck dissection on 07/15/20 at [facility].  PEG placed at [facility] on 09/22/20. Patient receiving chemotherapy and radiation.   Met with patient following radiation. Tolerating 6 cartons of glucerna 1.5 daily (same regimen) and prostat 48m BID.  Has increased water flush to 2410mBID between feedings.  Sipping on water and eating ice chips per SLP.  Notes reviewed.  Reports BM about every 3 days.  No nausea  SLP notes reviewed.     Medications: reviewed  Labs: reveiwed  Anthropometrics:   Weight 196 lb 4 oz 2/8 (med oncology) 191 on 1/24  216 lb 07/07/2020 prior to surgery  Estimated Energy Needs  Kcals: 2250-2700 Protein: 112-135 g Fluid: 2.2 L  NUTRITION DIAGNOSIS: Inadequate oral intake continues but relying on feeding tube     INTERVENTION:  Continue glucerna 1.5, 6 cartons per day.  Add additional 24036mID of water either drinking by mouth or via tube for better hydration.   Has adequate supplies from [employer] and received extension sets Has contact information    MONITORING, EVALUATION, GOAL: weight trends, tube feeding   NEXT VISIT: Feb 24 after radiation  Auther Lyerly B. AllZenia ResidesD,BeulahDNGilbert Creekgistered Dietitian [phone])

## 2020-11-12 ENCOUNTER — Ambulatory Visit: Payer: Medicare HMO | Admitting: Speech Pathology

## 2020-11-12 ENCOUNTER — Ambulatory Visit
Admission: RE | Admit: 2020-11-12 | Discharge: 2020-11-12 | Disposition: A | Discharge status: Home / Self Care | Payer: Medicare HMO | Source: Ambulatory Visit | Attending: Radiation Oncology | Admitting: Radiation Oncology

## 2020-11-12 ENCOUNTER — Other Ambulatory Visit: Payer: Self-pay

## 2020-11-12 DIAGNOSIS — Z9049 Acquired absence of other specified parts of digestive tract: Secondary | ICD-10-CM

## 2020-11-12 DIAGNOSIS — R471 Dysarthria and anarthria: Secondary | ICD-10-CM | POA: Diagnosis not present

## 2020-11-12 DIAGNOSIS — Z510 Encounter for antineoplastic radiation therapy: Secondary | ICD-10-CM | POA: Diagnosis not present

## 2020-11-13 NOTE — Therapy (Addendum)
[place] MAIN [facility] SERVICES [address] [place], [place], [address] Phone: [phone]   Fax:  [phone]  Speech Language Pathology Treatment  Patient Details  Name: Ronnie Buckley MRN: [id number] Date of Birth: [birth year] Referring Provider (SLP): Noreene Filbert   Encounter Date: 11/12/2020   End of Session - 11/13/20 1356    Visit Number 3    Number of Visits 13    Date for SLP Re-Evaluation 12/15/20    Authorization Type [employer]    Authorization Time Period 11/03/2020 thru 12/15/2020    Authorization - Visit Number 3    Progress Note Due on Visit 10    SLP Start Time 0800    SLP Stop Time  0845    SLP Time Calculation (min) 45 min    Activity Tolerance Patient tolerated treatment well           Past Medical History:  Diagnosis Date  . Actinic keratosis 03/03/2010   left dorsum hand, bx proven    No past surgical history on file.  There were no vitals filed for this visit.   Subjective Assessment - 11/13/20 1352    Subjective pt pleasant, had already downloaded text to speech app on ipad and iphone    Currently in Pain? No/denies          ST  CLINIC   [place]   NOTE          Treatment Data and Patient's Response to Treatment   Skilled treatment session focused on instruction in the Text to Speech! App. Pt brought in his iPad and iPhone with the app downloaded to both. He purchased the app to prevent advertisements from coming up. With supervision cues, pt navigated to show SLP various comments that he had entered at home. Instructional handout was provided on adding phrases to his favorites list, accessing starred phrases, adding a new folder to group phrases together, and settings for pitch and rate of speech. SLP provided moderate to minimal support to execute these functions. Additionally, SLP collaborated with pt and helped him create additional sentences such as "I am using this device to communicate. Can you  repeat that?, I need a refill of _____" Pt also instructed to pre-load information/questions prior to his appts or having his oil changed or unexpected home repairs.   Pt continues to be very excited about using this app for communication. He plans to begin realtime use beginning of next week. Skilled ST intervention continues to be required to instruct pt on higher level functions of app to increase his functional independence when communication, thereby increasing his ability to fully participate in his medical treatment of tongue cancer.        SLP Education - 11/13/20 1356    Education Details provided on text to speech app    Person(s) Educated Patient    Methods Explanation;Demonstration;Verbal cues;Handout    Comprehension Verbalized understanding;Returned demonstration;Need further instruction              SLP Long Term Goals - 11/03/20 1706      SLP LONG TERM GOAL #1   Title Pt will utilize multi-modal forms of communication to convey basic wants/needs in 75% of opportunities with various communication partners.    Baseline <25%    Time 6    Period Weeks    Status New    Target Date 12/15/20            Plan - 11/13/20 1357  Speech Therapy Frequency 2x / week    Duration 12 weeks    Treatment/Interventions Pharyngeal strengthening exercises;Internal/external aids;Compensatory techniques;SLP instruction and feedback;Patient/family education    Potential to Achieve Goals Good    SLP Home Exercise Plan provided see instruction section    Consulted and Agree with Plan of Care Patient           Patient will benefit from skilled therapeutic intervention in order to improve the following deficits and impairments:   Dysarthria and anarthria  S/P glossectomy    Problem List Patient Active Problem List   Diagnosis Date Noted  . Iron deficiency anemia 10/28/2020  . Goals of care, counseling/discussion 09/08/2020  . Open wound of leg without complication,  right, sequela 08/23/2020  . Oropharyngeal dysphagia 08/23/2020  . S/P glossectomy 08/23/2020  . 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 Hypercholesterolemia 08/02/2020  . Hypertension 08/02/2020  . Tongue cancer ([place]) 07/08/2020  . Type 2 diabetes mellitus with hyperglycemia, without long-term current use of insulin ([place]) 10/24/2018  . Primary osteoarthritis of both knees 12/02/2015   Leniyah Martell B. Rutherford Nail M.S., CCC-SLP, [place] Office [phone]  Stormy Fabian 11/13/2020, 2:00 PM  [place] MAIN [facility] SERVICES [address] [place], [place], [address] Phone: [phone]   Fax:  [phone]   Name: EMBER GOTTWALD MRN: [id number] Date of Birth: 28-Aug-1951

## 2020-11-13 NOTE — Patient Instructions (Signed)
Continue using app and collecting responses to put on app

## 2020-11-15 ENCOUNTER — Ambulatory Visit
Admission: RE | Admit: 2020-11-15 | Discharge: 2020-11-15 | Disposition: A | Discharge status: Home / Self Care | Payer: Medicare HMO | Source: Ambulatory Visit | Attending: Radiation Oncology | Admitting: Radiation Oncology

## 2020-11-15 DIAGNOSIS — Z510 Encounter for antineoplastic radiation therapy: Secondary | ICD-10-CM | POA: Diagnosis not present

## 2020-11-16 ENCOUNTER — Ambulatory Visit
Admission: RE | Admit: 2020-11-16 | Discharge: 2020-11-16 | Disposition: A | Discharge status: Home / Self Care | Payer: Medicare HMO | Source: Ambulatory Visit | Attending: Radiation Oncology | Admitting: Radiation Oncology

## 2020-11-16 ENCOUNTER — Inpatient Hospital Stay: Payer: Medicare HMO

## 2020-11-16 ENCOUNTER — Encounter: Payer: Self-pay | Admitting: Oncology

## 2020-11-16 ENCOUNTER — Inpatient Hospital Stay (HOSPITAL_BASED_OUTPATIENT_CLINIC_OR_DEPARTMENT_OTHER): Payer: Medicare HMO | Admitting: Oncology

## 2020-11-16 VITALS — BP 108/56 | HR 60 | Resp 18

## 2020-11-16 VITALS — BP 109/59 | HR 62 | Temp 96.4°F | Resp 18 | Wt 190.8 lb

## 2020-11-16 DIAGNOSIS — D509 Iron deficiency anemia, unspecified: Secondary | ICD-10-CM | POA: Diagnosis not present

## 2020-11-16 DIAGNOSIS — K1233 Oral mucositis (ulcerative) due to radiation: Secondary | ICD-10-CM

## 2020-11-16 DIAGNOSIS — D508 Other iron deficiency anemias: Secondary | ICD-10-CM

## 2020-11-16 DIAGNOSIS — Z5111 Encounter for antineoplastic chemotherapy: Secondary | ICD-10-CM

## 2020-11-16 DIAGNOSIS — C029 Malignant neoplasm of tongue, unspecified: Secondary | ICD-10-CM

## 2020-11-16 DIAGNOSIS — Z510 Encounter for antineoplastic radiation therapy: Secondary | ICD-10-CM | POA: Diagnosis not present

## 2020-11-16 LAB — BASIC METABOLIC PANEL
Anion gap: 10 (ref 5–15)
BUN: 29 mg/dL — ABNORMAL HIGH (ref 8–23)
CO2: 27 mmol/L (ref 22–32)
Calcium: 8.9 mg/dL (ref 8.9–10.3)
Chloride: 100 mmol/L (ref 98–111)
Creatinine, Ser: 0.62 mg/dL (ref 0.61–1.24)
GFR, Estimated: >60 mL/min (ref >60)
Glucose, Bld: 93 mg/dL (ref 70–99)
Potassium: 4.4 mmol/L (ref 3.5–5.1)
Sodium: 137 mmol/L (ref 135–145)

## 2020-11-16 LAB — CBC WITH DIFFERENTIAL/PLATELET
Abs Immature Granulocytes: 0.02 10*3/uL (ref 0.00–0.07)
Basophils Absolute: 0 10*3/uL (ref 0.0–0.1)
Basophils Relative: 0 %
Eosinophils Absolute: 0.1 10*3/uL (ref 0.0–0.5)
Eosinophils Relative: 1 %
HCT: 32.4 % — ABNORMAL LOW (ref 39.0–52.0)
Hemoglobin: 10.9 g/dL — ABNORMAL LOW (ref 13.0–17.0)
Immature Granulocytes: 0 %
Lymphocytes Relative: 11 %
Lymphs Abs: 0.6 10*3/uL — ABNORMAL LOW (ref 0.7–4.0)
MCH: 29.1 pg (ref 26.0–34.0)
MCHC: 33.6 g/dL (ref 30.0–36.0)
MCV: 86.4 fL (ref 80.0–100.0)
Monocytes Absolute: 0.4 10*3/uL (ref 0.1–1.0)
Monocytes Relative: 8 %
Neutro Abs: 4.4 10*3/uL (ref 1.7–7.7)
Neutrophils Relative %: 80 %
Platelets: 206 10*3/uL (ref 150–400)
RBC: 3.75 MIL/uL — ABNORMAL LOW (ref 4.22–5.81)
RDW: 15.5 % (ref 11.5–15.5)
WBC: 5.5 10*3/uL (ref 4.0–10.5)
nRBC: 0 % (ref 0.0–0.2)

## 2020-11-16 LAB — MAGNESIUM: Magnesium: 2.3 mg/dL (ref 1.7–2.4)

## 2020-11-16 MED ORDER — SODIUM CHLORIDE 0.9 % IV SOLN
Freq: Once | INTRAVENOUS | Status: AC
  Filled 2020-11-16: qty 250

## 2020-11-16 MED ORDER — POTASSIUM CHLORIDE IN NACL 20-0.9 MEQ/L-% IV SOLN
Freq: Once | INTRAVENOUS | Status: AC
  Filled 2020-11-16: qty 1000

## 2020-11-16 MED ORDER — HEPARIN SOD (PORK) LOCK FLUSH 100 UNIT/ML IV SOLN
500.0000 [IU] | Freq: Once | INTRAVENOUS | Status: AC
  Administered 2020-11-16: 500 [IU] via INTRAVENOUS
  Filled 2020-11-16: qty 5

## 2020-11-16 MED ORDER — MAGNESIUM SULFATE 2 GM/50ML IV SOLN
2.0000 g | Freq: Once | INTRAVENOUS | Status: AC
  Administered 2020-11-16: 2 g via INTRAVENOUS
  Filled 2020-11-16: qty 50

## 2020-11-16 MED ORDER — SODIUM CHLORIDE 0.9% FLUSH
10.0000 mL | INTRAVENOUS | Status: DC | PRN
  Administered 2020-11-16: 10 mL via INTRAVENOUS
  Filled 2020-11-16: qty 10

## 2020-11-16 MED ORDER — IRON SUCROSE 20 MG/ML IV SOLN
200.0000 mg | Freq: Once | INTRAVENOUS | Status: AC
  Administered 2020-11-16: 200 mg via INTRAVENOUS
  Filled 2020-11-16: qty 10

## 2020-11-16 MED ORDER — HEPARIN SOD (PORK) LOCK FLUSH 100 UNIT/ML IV SOLN
INTRAVENOUS | Status: AC
  Filled 2020-11-16: qty 5

## 2020-11-16 MED ORDER — SODIUM CHLORIDE 0.9 % IV SOLN
150.0000 mg | Freq: Once | INTRAVENOUS | Status: AC
  Administered 2020-11-16: 150 mg via INTRAVENOUS
  Filled 2020-11-16: qty 150

## 2020-11-16 MED ORDER — SODIUM CHLORIDE 0.9 % IV SOLN
10.0000 mg | Freq: Once | INTRAVENOUS | Status: AC
  Administered 2020-11-16: 10 mg via INTRAVENOUS
  Filled 2020-11-16: qty 10

## 2020-11-16 MED ORDER — PALONOSETRON HCL INJECTION 0.25 MG/5ML
0.2500 mg | Freq: Once | INTRAVENOUS | Status: AC
  Administered 2020-11-16: 0.25 mg via INTRAVENOUS
  Filled 2020-11-16: qty 5

## 2020-11-16 MED ORDER — SODIUM CHLORIDE 0.9 % IV SOLN
40.0000 mg/m2 | Freq: Once | INTRAVENOUS | Status: AC
  Administered 2020-11-16: 84 mg via INTRAVENOUS
  Filled 2020-11-16: qty 84

## 2020-11-16 MED ORDER — SODIUM CHLORIDE 0.9 % IV SOLN: 200.0000 mg | INTRAVENOUS | Status: DC

## 2020-11-16 NOTE — Progress Notes (Signed)
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 Conley Canal for a lesion that he identified on his left tongue. Biopsy on 06/15/2020 was consistent with squamous cell carcinoma. This was followed by a CT neck and chest. CT neck showed a large poorly defined infiltrative mass throughout the posterior left oral tongue and left oral base extending to the left glossotonsillar sulcus. The mass crosses midline through the lingual septum into the right hemitongue. Findings are concerning for left lateral retropharyngeal as well as level 1B283 and 4 nodal metastases. CT chest without contrast did not show any evidence of metastatic disease. Patient then underwent total glossectomy with bilateral neck dissection on 07/15/2020.  Pathology showed:Surgical Pathology Final Pathologic Diagnosis  A. LYMPH NODE, LEFT LEVEL 1B, DISSECTION: Benign salivary gland tissue. One out of six lymph nodes, positive for metastatic carcinoma (1/6).  B. LYMPH NODES, LEFT LEVEL 2A, 3, &4, DISSECTION: Sixteen lymph nodes, negative for metastatic carcinoma (0/16).  C. LYMPH NODES, LEFT LEVEL 2B, DISSECTION: Seven lymph nodes, negative for metastatic carcinoma (0/7).  D. LYMPH NODES, LEFT LEVEL 1A, DISSECTION: One lymph node, negative for metastatic carcinoma (0/1).  E. LYMPH NODES, RIGHT LEVEL 2A, 3, &4, DISSECTION: Eleven lymph nodes, negative for metastatic carcinoma (0/11).  F. LYMPH NODES,  RIGHT LEVEL 2A AND 2B, DISSECTION: Eleven lymph nodes, negative for metastatic carcinoma(0/11).  G. LINGUAL NERVE, RIGHT, EXCISION: Benign nerve tissue. No malignancy identified.  H. LYMPH NODES, RIGHT LEVEL 1B, DISSECTION: Benign salivary gland tissue. Three lymph nodes, negative for metastatic carcinoma. (0/3).  I. LINGUAL NERVE, PROXIMAL LEFT MARGIN, EXCISION: Benign peripheral nerve. No malignancy identified.  J. FLOOR OF MOUTH, LEFT MARGIN, EXCISION: Benign oropharyngeal mucosa. No malignancy identified.  K. SOFT PALATE, LEFT MARGIN, EXCISION: Benign oropharyngeal mucosa. No malignancy identified.  L. PHARYNGEAL MUCOSAL MARGIN, LEFT, EXCISION: Benign oropharyngeal mucosa. No malignancy identified.  M. VALLECULA MUCOSA MARGIN, EXCISION: Benign oropharyngeal mucosa. No malignancy identified.  N. PTERYGOID, DEEP TONSILLAR FOSSA MARGIN, EXCISION: Benign soft tissue. No malignancy identified.  O. TONGUE, TOTAL GLOSSECTOMY: Invasive moderately differentiated squamous cell carcinoma, keratinizing (4.6 cm). Tumor focally present at the deep and left tissue edges, please see separated margin status.  Lymphovascular invasion identified.  Perineural invasion is identified. Pathologic stage: pT4a pN1.  See CAP synoptic report.   Patient was evaluated by radiation oncology at [facility] and plan was to offer adjuvant concurrent chemoradiation. However patient lives in [place] and prefers to get care locally.Plan is for concurrent chemoradiation which was delayed due to dental clearance prior to starting radiation treatment. He also underwent tracheostomy removal, port placement and PEG tube placement at [place] history-tolerating chemotherapy and radiation well so far.  Reports that his throat is uncomfortable but denies any significant pain.  He has only been using Tylenol for this.  He continues to take 6 cans of Glucerna via his PEG tube  which is his main source of nutrition.  He is also following up with speech pathology  ECOG PS- 1 Pain scale- 3 Opioid associated  constipation- no  Review of systems- Review of Systems  Constitutional: Negative for chills, fever, malaise/fatigue and weight loss.  HENT: Negative for congestion, ear discharge and nosebleeds.        Throat pain  Eyes: Negative for blurred vision.  Respiratory: Negative for cough, hemoptysis, sputum production, shortness of breath and wheezing.   Cardiovascular: Negative for chest pain, palpitations, orthopnea and claudication.  Gastrointestinal: Negative for abdominal pain, blood in stool, constipation, diarrhea, heartburn, melena, nausea and vomiting.  Genitourinary: Negative for dysuria, flank pain, frequency, hematuria and urgency.  Musculoskeletal: Negative for back pain, joint pain and myalgias.  Skin: Negative for rash.  Neurological: Negative for dizziness, tingling, focal weakness, seizures, weakness and headaches.  Endo/Heme/Allergies: Does not bruise/bleed easily.  Psychiatric/Behavioral: Negative for depression and suicidal ideas. The patient does not have insomnia.       Allergies  Allergen Reactions  . Tape     Redness and bleeding with some tapes and adhesives.     Past Medical History:  Diagnosis Date  . Actinic keratosis 03/03/2010   left dorsum hand, bx proven     No past surgical history on file.  Social History   Socioeconomic History  . Marital status: Married    Spouse name: Not on file  . Number of children: Not on file  . Years of education: Not on file  . Highest education level: Not on file  Occupational History  . Not on file  Tobacco Use  . Smoking status: Not on file  . Smokeless tobacco: Not on file  Substance and Sexual Activity  . Alcohol use: Not Currently  . Drug use: Never  . Sexual activity: Not Currently  Other Topics Concern  . Not on file  Social History Narrative  . Not on file   Social  Determinants of Health   Financial Resource Strain: Not on file  Food Insecurity: Not on file  Transportation Needs: Not on file  Physical Activity: Not on file  Stress: Not on file  Social Connections: Not on file  Intimate Partner Violence: Not on file    No family history on file.   Current Outpatient Medications:  .  acetaminophen (TYLENOL) 500 MG tablet, Take 2 tablets by mouth every 6 (six) hours as needed., Disp: , Rfl:  .  Ascorbic Acid (VITAMIN C) 1000 MG tablet, Take 1,000 mg by mouth daily., Disp: , Rfl:  .  aspirin 325 MG tablet, Take 1 tablet by mouth in the morning and at bedtime. (Patient not taking: No sig reported), Disp: , Rfl:  .  aspirin EC 81 MG tablet, Take 81 mg by mouth daily. Swallow whole., Disp: , Rfl:  .  dexamethasone (DECADRON) 4 MG tablet, Take 1 tablet (4 mg total) by mouth 2 (two) times daily with a meal. Take 2 tablets Daily for 3 days, starting the day After cisplatin chemotherapy, Disp: 30 tablet, Rfl: 1 .  enalapril (VASOTEC) 10 MG tablet, Take 10 mg by mouth daily., Disp: , Rfl:  .  glipiZIDE (GLUCOTROL) 10 MG tablet, Take 10 mg by mouth daily before breakfast., Disp: , Rfl:  .  lidocaine-prilocaine (EMLA) cream, Apply 1 application topically as directed. aplly small amount of cream over the port 1 1/2 hours before each treatment, place saran wrap over cream to protect the clothing, Disp: 30 g, Rfl: 2 .  LORazepam (ATIVAN) 0.5 MG tablet, Take 1 tablet (0.5 mg total) by mouth every 8 (eight) hours as needed for anxiety., Disp: 30 tablet,  Rfl: 0 .  metFORMIN (GLUCOPHAGE) 500 MG tablet, Take 1,000 mg by mouth 2 (two) times daily., Disp: , Rfl:  .  Multiple Vitamin (MULTI-VITAMIN) tablet, Take 1 tablet by mouth daily., Disp: , Rfl:  .  ondansetron (ZOFRAN) 8 MG tablet, Take 1 tablet (8 mg total) by mouth every 8 (eight) hours as needed for nausea or vomiting. Start on the 3rd day after chemotherapy, Disp: 20 tablet, Rfl: 0 .  pravastatin (PRAVACHOL) 40 MG  tablet, Take 1 tablet by mouth daily., Disp: , Rfl:  .  prochlorperazine (COMPAZINE) 10 MG tablet, Take 1 tablet (10 mg total) by mouth every 6 (six) hours as needed for nausea or vomiting., Disp: 30 tablet, Rfl: 0 .  sucralfate (CARAFATE) 1 g tablet, Take 0.5 tablets (0.5 g total) by mouth 3 (three) times daily. Dissolve in 3-4 tbsp warm water, swish and swallow., Disp: 60 tablet, Rfl: 1 No current facility-administered medications for this visit.  Facility-Administered Medications Ordered in Other Visits:  .  heparin lock flush 100 unit/mL, 500 Units, Intravenous, Once, Randa Evens C, MD .  sodium chloride flush (NS) 0.9 % injection 10 mL, 10 mL, Intravenous, PRN, Sindy Guadeloupe, MD, 10 mL at 11/16/20 1287  Physical exam: There were no vitals filed for this visit. Physical Exam Eyes:     Extraocular Movements: EOM normal.  Cardiovascular:     Rate and Rhythm: Normal rate and regular rhythm.     Heart sounds: Normal heart sounds.  Pulmonary:     Effort: Pulmonary effort is normal.     Breath sounds: Normal breath sounds.  Abdominal:     General: Bowel sounds are normal.     Palpations: Abdomen is soft.  Skin:    General: Skin is warm and dry.  Neurological:     Mental Status: He is alert and oriented to person, place, and time.      CMP Latest Ref Rng & Units 11/09/2020  Glucose 70 - 99 mg/dL 74  BUN 8 - 23 mg/dL 27(H)  Creatinine 0.61 - 1.24 mg/dL 0.59(L)  Sodium 135 - 145 mmol/L 138  Potassium 3.5 - 5.1 mmol/L 4.2  Chloride 98 - 111 mmol/L 102  CO2 22 - 32 mmol/L 27  Calcium 8.9 - 10.3 mg/dL 8.7(L)   CBC Latest Ref Rng & Units 11/09/2020  WBC 4.0 - 10.5 K/uL 5.6  Hemoglobin 13.0 - 17.0 g/dL 10.4(L)  Hematocrit 39.0 - 52.0 % 30.7(L)  Platelets 150 - 400 K/uL 253       Assessment and plan- Patient is a 71 y.o. 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 He is here for on  treatment assessment prior to cycle 4 of weekly cisplatin chemotherapy  Counts okay to proceed with cycle 4 of weekly cisplatin chemotherapy.  We will proceed with cycle 5 next week and I will see him back in 2 weeks for cycle 6.  This would be his last chemotherapy.  Patient completes radiation treatment on 12/03/2020.  Plan to repeat PET CT scan 3 months after chemoradiation is done.  Normocytic anemia: Likely a combination of anemia of chronic disease and chemo-induced anemia as well as iron deficiency.  He received 1 dose of [place] for last week and will continue to receive 3 more doses with his chemo treatments.    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  He does not wish to use any Magic  mouthwash or opioids at this time.  Continue to monitor     Visit Diagnosis 1. Encounter for antineoplastic chemotherapy   2. Tongue cancer ([place])   3. Mucositis due to radiation therapy   4. Iron deficiency anemia, unspecified iron deficiency anemia type      Dr. Randa Evens, MD, MPH [facility] [phone] 11/16/2020 8:23 AM

## 2020-11-16 NOTE — Progress Notes (Signed)
1505- Patient tolerated treatment well. Patient stable and discharged to home at this time.

## 2020-11-17 ENCOUNTER — Ambulatory Visit: Payer: Medicare HMO | Admitting: Speech Pathology

## 2020-11-17 ENCOUNTER — Other Ambulatory Visit: Payer: Self-pay

## 2020-11-17 ENCOUNTER — Ambulatory Visit
Admission: RE | Admit: 2020-11-17 | Discharge: 2020-11-17 | Disposition: A | Discharge status: Home / Self Care | Payer: Medicare HMO | Source: Ambulatory Visit | Attending: Radiation Oncology | Admitting: Radiation Oncology

## 2020-11-17 DIAGNOSIS — Z9049 Acquired absence of other specified parts of digestive tract: Secondary | ICD-10-CM

## 2020-11-17 DIAGNOSIS — R471 Dysarthria and anarthria: Secondary | ICD-10-CM

## 2020-11-17 DIAGNOSIS — Z510 Encounter for antineoplastic radiation therapy: Secondary | ICD-10-CM | POA: Diagnosis not present

## 2020-11-17 DIAGNOSIS — C029 Malignant neoplasm of tongue, unspecified: Secondary | ICD-10-CM

## 2020-11-17 NOTE — Therapy (Signed)
[place] MAIN [facility] SERVICES [address] [place], [place], [address] Phone: [phone]   Fax:  [phone]  Speech Language Pathology Treatment  Patient Details  Name: Ronnie Buckley MRN: [id number] Date of Birth: [birth year] Referring Provider (SLP): Noreene Filbert   Encounter Date: 11/17/2020   End of Session - 11/17/20 1203    Visit Number 4    Number of Visits 13    Date for SLP Re-Evaluation 12/15/20    Authorization Type [employer]    Authorization Time Period 11/03/2020 thru 12/15/2020    Authorization - Visit Number 4    Progress Note Due on Visit 10    SLP Start Time 0800    SLP Stop Time  0900    SLP Time Calculation (min) 60 min    Activity Tolerance Patient tolerated treatment well           Past Medical History:  Diagnosis Date  . Actinic keratosis 03/03/2010   left dorsum hand, bx proven    No past surgical history on file.  There were no vitals filed for this visit.   Subjective Assessment - 11/17/20 1200    Subjective pt pleasant, came to session with questions about text to speech app    Currently in Pain? No/denies            Neuro   ST   TX   NOTE          Treatment Data and Patient's Response to Treatment   Skilled treatment session focused on continued instruction of Text to Speech! App . Pt arrived to session with questions regarding Text to Speech! App and how to create specific folders. Pt brought in iPad with phrases and folders created from previous session.  SLP facilitated session by having pt review steps required to favorite phrases, create a folder and clear recent phrases. Pt with moderate cues for recall of required steps. Upon reviewing instructions, pt required minimal assistance to create favorite phrases and clear recent phrases. Moderate faded to minimal assistance was required for pt to accurately create new folders or move phrases into folders. SLP and pt went through list of  pt's medications and patient created a list in the app with supervision cues as needed. SLP then discussed with pt practicing phone calls for next session which then led to the pt wanting to create an "emergency" folder consisting of appropriate phrases he can use for phone calls. Pt continues to show interest and excitement in using the app for his primary method of communication. Skilled ST intervention continues to be required to further instruct the pt on higher level functions of the app to increase functional independence when communication, thereby increasing his ability to fully participate in his medical treatment of tongue cancer.     ADULT SLP TREATMENT - 11/17/20 0001      General Information   Behavior/Cognition Alert;Cooperative;Pleasant mood    HPI Pt is s/p an ALT free flap reconstruction of total glossectomy on 07/30/20 d/t squamous cell carcinoma of the oral tongue. In addition to pt's total glossectomy, he also had left modified radical neck dissection, right modified radical neck dissection and left partial pharyngectomy. Recent Flexible Endoscopic Evaluation of Swallowing (FEES) was conducted on 10/13/2020. At that time, pt demonstrated a severe oropharyngeal dysphagia and it was recommended that patient continue nutrition/hydration/medication through the G-tube, with teaspoon sips of water and/or ice chips by mouth following meticulous oral care. Effortful Swallow and [employer] were  introduced. Pt is currently undergoing chemoRT at [facility].      Pain Assessment   Pain Assessment No/denies pain      Cognitive-Linquistic Treatment   Treatment focused on Patient/family/caregiver education;Other (comment)   AAC     Assessment / Recommendations / Plan   Plan Continue with current plan of care      Progression Toward Goals   Progression toward goals Progressing toward goals            SLP Education - 11/17/20 1203    Education Details provided on text  to speech app    Person(s) Educated Patient    Methods Explanation;Demonstration;Verbal cues;Handout    Comprehension Verbalized understanding;Returned demonstration;Verbal cues required;Need further instruction              SLP Long Term Goals - 11/03/20 1706      SLP LONG TERM GOAL #1   Title Pt will utilize multi-modal forms of communication to convey basic wants/needs in 75% of opportunities with various communication partners.    Baseline <25%    Time 6    Period Weeks    Status New    Target Date 12/15/20            Plan - 11/17/20 1203    Clinical Impression Statement Skilled treatment session focused on continued instruction of Text to Speech! App . Pt arrived to session with questions regarding Text to Speech! App and how to create specific folders. Pt brought in iPad with phrases and folders created from previous session.  SLP facilitated session by having pt review steps required to favorite phrases, create a folder and clear recent phrases. Pt with moderate cues for recall of required steps. Upon reviewing instructions, pt required minimal assistance to create favorite phrases and clear recent phrases. Moderate faded to minimal assistance was required for pt to accurately create new folders or move phrases into folders. SLP and pt went through list of pt's medications and patient created a list in the app with supervision cues as needed. SLP then discussed with pt practicing phone calls for next session which then led to the pt wanting to create an "emergency" folder consisting of appropriate phrases he can use for phone calls. Pt continues to show interest and excitement in using the app for his primary method of communication. Skilled ST intervention continues to be required to further instruct the pt on higher level functions of the app to increase functional independence when communication, thereby increasing his ability to fully participate in his medical treatment of  tongue cancer.    Speech Therapy Frequency 2x / week    Duration 12 weeks    Treatment/Interventions Pharyngeal strengthening exercises;Internal/external aids;Compensatory techniques;SLP instruction and feedback;Patient/family education    Potential to Achieve Goals Good    Potential Considerations Severity of impairments    Consulted and Agree with Plan of Care Patient           Patient will benefit from skilled therapeutic intervention in order to improve the following deficits and impairments:   Dysarthria and anarthria  S/P glossectomy  Tongue cancer [facility])    Problem List Patient Active Problem List   Diagnosis Date Noted  . Iron deficiency anemia 10/28/2020  . Goals of care, counseling/discussion 09/08/2020  . Open wound of leg without complication, right, sequela 08/23/2020  . Oropharyngeal dysphagia 08/23/2020  . S/P glossectomy 08/23/2020  . 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 Hypercholesterolemia 08/02/2020  . Hypertension 08/02/2020  . Tongue cancer (  [place]) 07/08/2020  . Type 2 diabetes mellitus with hyperglycemia, without long-term current use of insulin ([place]) 10/24/2018  . Primary osteoarthritis of both knees 12/02/2015   Gabrielle Dare, Student-SLP  Gabrielle Dare 11/17/2020, 12:39 PM  [place] MAIN [facility] SERVICES [address] [place], [place], [address] Phone: [phone]   Fax:  [phone]   Name: Ronnie Buckley MRN: [id number] Date of Birth: Apr 04, 1951

## 2020-11-18 ENCOUNTER — Ambulatory Visit
Admission: RE | Admit: 2020-11-18 | Discharge: 2020-11-18 | Disposition: A | Discharge status: Home / Self Care | Payer: Medicare HMO | Source: Ambulatory Visit | Attending: Radiation Oncology | Admitting: Radiation Oncology

## 2020-11-18 DIAGNOSIS — Z510 Encounter for antineoplastic radiation therapy: Secondary | ICD-10-CM | POA: Diagnosis not present

## 2020-11-19 ENCOUNTER — Ambulatory Visit
Admission: RE | Admit: 2020-11-19 | Discharge: 2020-11-19 | Disposition: A | Discharge status: Home / Self Care | Payer: Medicare HMO | Source: Ambulatory Visit | Attending: Radiation Oncology | Admitting: Radiation Oncology

## 2020-11-19 ENCOUNTER — Ambulatory Visit: Payer: Medicare HMO | Admitting: Speech Pathology

## 2020-11-19 ENCOUNTER — Other Ambulatory Visit: Payer: Self-pay

## 2020-11-19 DIAGNOSIS — Z9049 Acquired absence of other specified parts of digestive tract: Secondary | ICD-10-CM

## 2020-11-19 DIAGNOSIS — R471 Dysarthria and anarthria: Secondary | ICD-10-CM | POA: Diagnosis not present

## 2020-11-19 DIAGNOSIS — Z510 Encounter for antineoplastic radiation therapy: Secondary | ICD-10-CM | POA: Diagnosis not present

## 2020-11-19 NOTE — Patient Instructions (Signed)
Increasing efficiency of moving utterances between folders.

## 2020-11-19 NOTE — Therapy (Signed)
[place] MAIN [facility] SERVICES [address] [place], [place], [address] Phone: [phone]   Fax:  [phone]  Speech Language Pathology Treatment  Patient Details  Name: Ronnie Buckley MRN: [id number] Date of Birth: 09/14/1951 Referring Provider (SLP): Noreene Filbert   Encounter Date: 11/19/2020   End of Session - 11/19/20 0856    Visit Number 5    Number of Visits 13    Date for SLP Re-Evaluation 12/15/20    Authorization Type [employer] Medicare    Authorization Time Period 11/03/2020 thru 12/15/2020    Authorization - Visit Number 5    Progress Note Due on Visit 10    SLP Start Time 0800    SLP Stop Time  0845    SLP Time Calculation (min) 45 min    Activity Tolerance Patient tolerated treatment well           Past Medical History:  Diagnosis Date  . Actinic keratosis 03/03/2010   left dorsum hand, bx proven    No past surgical history on file.  There were no vitals filed for this visit.   Subjective Assessment - 11/19/20 0853    Subjective pt pleasant, came to session with questions about text to speech app    Currently in Pain? --   expreiencing generalized pain to throat from radiation         Ronnie Buckley and Patient's Response to Treatment   Skilled treatment session focused on pt's use of alternate form of communication, TEXT TO SPEECH. Pt continues to state that he is "happy" despite current circumstances. The app on his iPhone was not speaking, which makes several times that this has occurred. SLP wrote down directions on way to re-start voicing. Information written down and pt practiced. He will also keep up with the frequency of this occurring. Pt updated utterances to include his neighbor's contact info, "I would like to make a payment, What is the current balance on my bill?" He required more than a reasonable amount of time and the written directions to transfer these utterances  into appropriate folders.   Pt has demonstrated more frequent throat clearing. He stated that his tongue and throat are becoming sore and his secretions are getting thicker. Continued education provided on consuming water to aid in thinning his secretions.   Pt continues to demonstrate increased knowledge of TEXT TO SPEECH app but continues to require more than a reasonable amount of time to efficiently navigate. He is also not using for "free texting" to respond to questions. As such, skilled St intervention if required to increase pt's independent automaticity of communication via app and pt's speech intelligibility is < 50%.          SLP Education - 11/19/20 0855    Education Details increase secretions from radiation, increased fatigue    Person(s) Educated Patient    Methods Explanation;Demonstration;Verbal cues;Handout    Comprehension Verbalized understanding;Returned demonstration;Verbal cues required              SLP Long Term Goals - 11/03/20 1706      SLP LONG TERM GOAL #1   Title Pt will utilize multi-modal forms of communication to convey basic wants/needs in 75% of opportunities with various communication partners.    Baseline <25%    Time 6    Period Weeks    Status New  Target Date 12/15/20            Plan - 11/19/20 0856    Speech Therapy Frequency 2x / week    Duration 12 weeks    Treatment/Interventions Pharyngeal strengthening exercises;Internal/external aids;Compensatory techniques;SLP instruction and feedback;Patient/family education    Potential to Achieve Goals Good    Potential Considerations Severity of impairments    SLP Home Exercise Plan provided, see instruction section    Consulted and Agree with Plan of Care Patient           Patient will benefit from skilled therapeutic intervention in order to improve the following deficits and impairments:   Dysarthria and anarthria  S/P glossectomy    Problem List Patient Active Problem  List   Diagnosis Date Noted  . Iron deficiency anemia 10/28/2020  . Goals of care, counseling/discussion 09/08/2020  . Open wound of leg without complication, right, sequela 08/23/2020  . Oropharyngeal dysphagia 08/23/2020  . S/P glossectomy 08/23/2020  . 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 Hypercholesterolemia 08/02/2020  . Hypertension 08/02/2020  . Tongue cancer ([place]) 07/08/2020  . Type 2 diabetes mellitus with hyperglycemia, without long-term current use of insulin ([place]) 10/24/2018  . Primary osteoarthritis of both knees 12/02/2015    Ronnie Buckley 11/19/2020, 8:57 AM  [place] MAIN [facility] SERVICES [address] [place], [place], [address] Phone: [phone]   Fax:  [phone]  Ronnie Buckley B. Rutherford Nail M.S., [place], [place] Office [phone]  Name: Ronnie Buckley MRN: [id number] Date of Birth: [birth year]

## 2020-11-22 ENCOUNTER — Encounter: Payer: Self-pay | Admitting: *Deleted

## 2020-11-22 ENCOUNTER — Other Ambulatory Visit: Payer: Self-pay | Admitting: *Deleted

## 2020-11-22 ENCOUNTER — Ambulatory Visit
Admission: RE | Admit: 2020-11-22 | Discharge: 2020-11-22 | Disposition: A | Discharge status: Home / Self Care | Payer: Medicare HMO | Source: Ambulatory Visit | Attending: Radiation Oncology | Admitting: Radiation Oncology

## 2020-11-22 DIAGNOSIS — Z510 Encounter for antineoplastic radiation therapy: Secondary | ICD-10-CM | POA: Diagnosis not present

## 2020-11-22 MED ORDER — MAGIC MOUTHWASH W/LIDOCAINE
5.0000 mL | Freq: Four times a day (QID) | ORAL | 1 refills | Status: DC | PRN
Start: 2020-11-22 — End: 2021-03-29

## 2020-11-22 NOTE — Telephone Encounter (Signed)
Called pt and and got voicemail and said that I spoke to dr Janese Banks and just faxed MMW with lidocaine to his [facility]

## 2020-11-23 ENCOUNTER — Other Ambulatory Visit: Payer: Self-pay | Admitting: Oncology

## 2020-11-23 ENCOUNTER — Ambulatory Visit
Admission: RE | Admit: 2020-11-23 | Discharge: 2020-11-23 | Disposition: A | Discharge status: Home / Self Care | Payer: Medicare HMO | Source: Ambulatory Visit | Attending: Radiation Oncology | Admitting: Radiation Oncology

## 2020-11-23 ENCOUNTER — Inpatient Hospital Stay: Payer: Medicare HMO

## 2020-11-23 ENCOUNTER — Other Ambulatory Visit: Payer: Self-pay

## 2020-11-23 ENCOUNTER — Ambulatory Visit: Payer: Medicare HMO | Admitting: Oncology

## 2020-11-23 VITALS — BP 104/66 | HR 64 | Temp 96.0°F | Resp 17 | Wt 188.4 lb

## 2020-11-23 DIAGNOSIS — C029 Malignant neoplasm of tongue, unspecified: Secondary | ICD-10-CM

## 2020-11-23 DIAGNOSIS — D508 Other iron deficiency anemias: Secondary | ICD-10-CM

## 2020-11-23 DIAGNOSIS — Z510 Encounter for antineoplastic radiation therapy: Secondary | ICD-10-CM | POA: Diagnosis not present

## 2020-11-23 DIAGNOSIS — Z5111 Encounter for antineoplastic chemotherapy: Secondary | ICD-10-CM | POA: Diagnosis not present

## 2020-11-23 LAB — BASIC METABOLIC PANEL
Anion gap: 10 (ref 5–15)
BUN: 32 mg/dL — ABNORMAL HIGH (ref 8–23)
CO2: 26 mmol/L (ref 22–32)
Calcium: 8.6 mg/dL — ABNORMAL LOW (ref 8.9–10.3)
Chloride: 100 mmol/L (ref 98–111)
Creatinine, Ser: 0.6 mg/dL — ABNORMAL LOW (ref 0.61–1.24)
GFR, Estimated: >60 mL/min (ref >60)
Glucose, Bld: 104 mg/dL — ABNORMAL HIGH (ref 70–99)
Potassium: 4.4 mmol/L (ref 3.5–5.1)
Sodium: 136 mmol/L (ref 135–145)

## 2020-11-23 LAB — CBC WITH DIFFERENTIAL/PLATELET
Abs Immature Granulocytes: 0.01 10*3/uL (ref 0.00–0.07)
Basophils Absolute: 0 10*3/uL (ref 0.0–0.1)
Basophils Relative: 1 %
Eosinophils Absolute: 0 10*3/uL (ref 0.0–0.5)
Eosinophils Relative: 1 %
HCT: 31.6 % — ABNORMAL LOW (ref 39.0–52.0)
Hemoglobin: 10.6 g/dL — ABNORMAL LOW (ref 13.0–17.0)
Immature Granulocytes: 0 %
Lymphocytes Relative: 13 %
Lymphs Abs: 0.5 10*3/uL — ABNORMAL LOW (ref 0.7–4.0)
MCH: 29.4 pg (ref 26.0–34.0)
MCHC: 33.5 g/dL (ref 30.0–36.0)
MCV: 87.8 fL (ref 80.0–100.0)
Monocytes Absolute: 0.3 10*3/uL (ref 0.1–1.0)
Monocytes Relative: 8 %
Neutro Abs: 3.2 10*3/uL (ref 1.7–7.7)
Neutrophils Relative %: 77 %
Platelets: 161 10*3/uL (ref 150–400)
RBC: 3.6 MIL/uL — ABNORMAL LOW (ref 4.22–5.81)
RDW: 16.3 % — ABNORMAL HIGH (ref 11.5–15.5)
WBC: 4.1 10*3/uL (ref 4.0–10.5)
nRBC: 0 % (ref 0.0–0.2)

## 2020-11-23 LAB — MAGNESIUM: Magnesium: 2.4 mg/dL (ref 1.7–2.4)

## 2020-11-23 MED ORDER — MAGNESIUM SULFATE 2 GM/50ML IV SOLN
2.0000 g | Freq: Once | INTRAVENOUS | Status: AC
  Administered 2020-11-23: 2 g via INTRAVENOUS
  Filled 2020-11-23: qty 50

## 2020-11-23 MED ORDER — SODIUM CHLORIDE 0.9% FLUSH
10.0000 mL | Freq: Once | INTRAVENOUS | Status: AC
  Administered 2020-11-23: 10 mL via INTRAVENOUS
  Filled 2020-11-23: qty 10

## 2020-11-23 MED ORDER — IRON SUCROSE 20 MG/ML IV SOLN
200.0000 mg | Freq: Once | INTRAVENOUS | Status: AC
  Administered 2020-11-23: 200 mg via INTRAVENOUS
  Filled 2020-11-23: qty 10

## 2020-11-23 MED ORDER — SODIUM CHLORIDE 0.9 % IV SOLN
Freq: Once | INTRAVENOUS | Status: AC
  Filled 2020-11-23: qty 250

## 2020-11-23 MED ORDER — HEPARIN SOD (PORK) LOCK FLUSH 100 UNIT/ML IV SOLN
500.0000 [IU] | Freq: Once | INTRAVENOUS | Status: AC
  Administered 2020-11-23: 500 [IU] via INTRAVENOUS
  Filled 2020-11-23: qty 5

## 2020-11-23 MED ORDER — SODIUM CHLORIDE 0.9 % IV SOLN: 200.0000 mg | INTRAVENOUS | Status: DC

## 2020-11-23 MED ORDER — SODIUM CHLORIDE 0.9 % IV SOLN
10.0000 mg | Freq: Once | INTRAVENOUS | Status: AC
  Administered 2020-11-23: 10 mg via INTRAVENOUS
  Filled 2020-11-23: qty 10

## 2020-11-23 MED ORDER — HEPARIN SOD (PORK) LOCK FLUSH 100 UNIT/ML IV SOLN
500.0000 [IU] | Freq: Once | INTRAVENOUS | Status: DC | PRN
  Filled 2020-11-23: qty 5

## 2020-11-23 MED ORDER — SODIUM CHLORIDE 0.9 % IV SOLN
150.0000 mg | Freq: Once | INTRAVENOUS | Status: AC
  Administered 2020-11-23: 150 mg via INTRAVENOUS
  Filled 2020-11-23: qty 150

## 2020-11-23 MED ORDER — SODIUM CHLORIDE 0.9 % IV SOLN
40.0000 mg/m2 | Freq: Once | INTRAVENOUS | Status: AC
  Administered 2020-11-23: 84 mg via INTRAVENOUS
  Filled 2020-11-23: qty 84

## 2020-11-23 MED ORDER — PALONOSETRON HCL INJECTION 0.25 MG/5ML
0.2500 mg | Freq: Once | INTRAVENOUS | Status: AC
  Administered 2020-11-23: 0.25 mg via INTRAVENOUS
  Filled 2020-11-23: qty 5

## 2020-11-23 MED ORDER — POTASSIUM CHLORIDE IN NACL 20-0.9 MEQ/L-% IV SOLN
Freq: Once | INTRAVENOUS | Status: AC
  Filled 2020-11-23: qty 1000

## 2020-11-23 MED ORDER — HEPARIN SOD (PORK) LOCK FLUSH 100 UNIT/ML IV SOLN
INTRAVENOUS | Status: AC
  Filled 2020-11-23: qty 5

## 2020-11-24 ENCOUNTER — Ambulatory Visit: Payer: Medicare HMO | Admitting: Speech Pathology

## 2020-11-24 ENCOUNTER — Ambulatory Visit
Admission: RE | Admit: 2020-11-24 | Discharge: 2020-11-24 | Disposition: A | Discharge status: Home / Self Care | Payer: Medicare HMO | Source: Ambulatory Visit | Attending: Radiation Oncology | Admitting: Radiation Oncology

## 2020-11-24 DIAGNOSIS — R471 Dysarthria and anarthria: Secondary | ICD-10-CM | POA: Diagnosis not present

## 2020-11-24 DIAGNOSIS — Z9049 Acquired absence of other specified parts of digestive tract: Secondary | ICD-10-CM

## 2020-11-24 DIAGNOSIS — C029 Malignant neoplasm of tongue, unspecified: Secondary | ICD-10-CM

## 2020-11-24 DIAGNOSIS — Z510 Encounter for antineoplastic radiation therapy: Secondary | ICD-10-CM | POA: Diagnosis not present

## 2020-11-24 NOTE — Therapy (Addendum)
[place] MAIN [facility] SERVICES [address] [place], [place], [address] Phone: [phone]   Fax:  [phone]  Speech Language Pathology Treatment  Patient Details  Name: Ronnie Buckley MRN: [id number] Date of Birth: September 18, 1951 Referring Provider (SLP): Noreene Filbert   Encounter Date: 11/24/2020   End of Session - 11/24/20 1218    Visit Number 6    Number of Visits 13    Date for SLP Re-Evaluation 12/15/20    Authorization Type [employer]na Medicare    Authorization Time Period 11/03/2020 thru 12/15/2020    Authorization - Visit Number 6    Progress Note Due on Visit 10    SLP Start Time 0800    SLP Stop Time  0900    SLP Time Calculation (min) 60 min    Activity Tolerance Patient tolerated treatment well           Past Medical History:  Diagnosis Date  . Actinic keratosis 03/03/2010   left dorsum hand, bx proven    No past surgical history on file.  There were no vitals filed for this visit.   Subjective Assessment - 11/24/20 1208    Subjective Pt engaged, pleasant, states "I am blessed. I am happy"    Currently in Pain? No/denies             ST  CLINIC   TX   NOTE          Treatment Data and Patient's Response to Treatment   Skilled ST intervention targeted pt's alternate form of communication by using "Text to Speech!" app on iPad. Pt stated he is taking magic mouthwash that has helped soothe his throat and decrease amount of coughing. Continuous throat clears and an increasingly wet voice was observed during today's session. SLP and pt spent time calibrating voice Marjory Lies) on apple account to pt's liking, and pt took a picture of the pitch/rating settings for future reference. Pt required minimal assistance to access the app settings. Pt frequently stated during session "I am not quick" in reference to typing out a spontaneous message. Pt spent ~1 minute to type 14 words. SLP therefore educated pt on word prediction which then  reduced amount of time to 30 seconds for similar length of message. Pt required cues to use the app when responding to open-ended questions; however, he began using the app independently for responses as the session progressed. Pt stated "you've been pushing pretty hard, just kidding!" in response to writing spontaneous messages and increasing length of messages. SLP then followed up with pt regarding further instrumental swallow evaluation. Pt stated it's "more of a social aspect. They can eat steak, I can enjoy soup". Pt is currently consuming water and ice chips; however, he reports his throat "feels like sunburn night and day". Pt is continuing to perform swallowing exercises provided. While pt is progressing at demonstrating knowledge of "Text to Speech!", pt continues to require cues to navigate app and produce messages efficiently. Given this information, skilled ST intervention continues to be required to increase pt's independence of communication via app and pt's speech intelligibility remains < 50%.      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 In addition to pt's total glossectomy, he also had left modified radical neck dissection, right  modified radical neck dissection and left partial pharyngectomy. Recent Flexible Endoscopic Evaluation of Swallowing (FEES) was conducted on 10/13/2020. At that time, pt demonstrated a severe oropharyngeal dysphagia and it was recommended that patient continue nutrition/hydration/medication through the G-tube, with teaspoon sips of water and/or ice chips by mouth following meticulous oral care. Effortful Swallow and [employer] were introduced. Pt is currently undergoing chemoRT at [facility] center.      Pain Assessment   Pain Assessment No/denies  pain      Cognitive-Linquistic Treatment   Treatment focused on Patient/family/caregiver education;Other (comment)   AAC     Assessment / Recommendations / Plan   Plan Continue with current plan of care      Progression Toward Goals   Progression toward goals Progressing toward goals            SLP Education - 11/24/20 1211    Education Details free texting scenarios vs loaded messages    Person(s) Educated Patient    Methods Explanation;Demonstration;Verbal cues    Comprehension Verbalized understanding;Returned demonstration;Verbal cues required              SLP Long Term Goals - 11/03/20 1706      SLP LONG TERM GOAL #1   Title Pt will utilize multi-modal forms of communication to convey basic wants/needs in 75% of opportunities with various communication partners.    Baseline <25%    Time 6    Period Weeks    Status New    Target Date 12/15/20            Plan - 11/24/20 1219        Speech Therapy Frequency 2x / week    Duration 12 weeks    Treatment/Interventions Pharyngeal strengthening exercises;Internal/external aids;Compensatory techniques;SLP instruction and feedback;Patient/family education    Potential to Achieve Goals Good    Potential Considerations Severity of impairments    Consulted and Agree with Plan of Care Patient           Patient will benefit from skilled therapeutic intervention in order to improve the following deficits and impairments:   S/P glossectomy  Tongue cancer ([place])  Dysarthria and anarthria    Problem List Patient Active Problem List   Diagnosis Date Noted  . Iron deficiency anemia 10/28/2020  . Goals of care, counseling/discussion 09/08/2020  . Open wound of leg without complication, right, sequela 08/23/2020  . Oropharyngeal dysphagia 08/23/2020  . S/P glossectomy 08/23/2020  . 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 Hypercholesterolemia 08/02/2020  . Hypertension 08/02/2020  . Tongue cancer ([place])  07/08/2020  . Type 2 diabetes mellitus with hyperglycemia, without long-term current use of insulin ([place]) 10/24/2018  . Primary osteoarthritis of both knees 12/02/2015   Gabrielle Dare, Student-SLP   Curlie Sittner B. Rutherford Nail M.S., CCC-SLP, [place] Pathologist Rehabilitation Services Office [phone]  Stormy Fabian 11/24/2020, 2:58 PM  [place] MAIN [facility] SERVICES [address] [place], [place], [address] Phone: [phone]   Fax:  [phone]   Name: Ronnie Buckley MRN: [id number] Date of Birth: [birth year]

## 2020-11-25 ENCOUNTER — Inpatient Hospital Stay: Payer: Medicare HMO

## 2020-11-25 ENCOUNTER — Ambulatory Visit
Admission: RE | Admit: 2020-11-25 | Discharge: 2020-11-25 | Disposition: A | Discharge status: Home / Self Care | Payer: Medicare HMO | Source: Ambulatory Visit | Attending: Radiation Oncology | Admitting: Radiation Oncology

## 2020-11-25 DIAGNOSIS — Z510 Encounter for antineoplastic radiation therapy: Secondary | ICD-10-CM | POA: Diagnosis not present

## 2020-11-25 NOTE — Progress Notes (Signed)
Nutrition Follow-up:   Patient with stage IV tongue cancer followed by Dr Janese Banks.  S/p total glossectomy with bilateral neck dissection on 07/15/20 at [facility].  PEG placed at [facility] on 09/22/20. Patient receiving chemotherapy and radiation.    Met with patient following radiation.  Patient reports that he continues with 6 cartons of glucerna 1.6 via feeding tube and prostat 62m BID.  Bowel movement this am and on Monday prior to the bowel movement today. Using magic mouthwash to help with mouth pain.  Seeing SLP.  Sipping on water and ice chips.  Patient is subtracting oral water from water via PEG.      Medications: reviewed  Labs: reviewed  Anthropometrics:   Weight 190 lb 6 oz on radiation scale (today at RD visit).    Noted 2/23 per [facility] 191 lb 8 oz 2/22 188 lb (med oncology) 196 lb 4 oz on 2/9 (med oncology) 191 lb on 1/24   Estimated Energy Needs  Kcals: [address]Protein: 112-135 g Fluid: 2.2 L  NUTRITION DIAGNOSIS: Inadequate oral intake but relying on feeding tube   INTERVENTION:  Discussed with patient that if weight decreases will need to increase glucerna 1.5 via feeding tube.  For now will continue with 6 cartons daily and monitor weight with 363mprostat BID Tube feeding provides 2336 calories, 147 g protein and 2216 ml free water. Patient has contact information      MONITORING, EVALUATION, GOAL: weight trends, tube feeding   NEXT VISIT: March 3 after radiation   Tylicia Sherman B. AlZenia ResidesRDKingstonLDMasaryktownegistered Dietitian [phone])

## 2020-11-26 ENCOUNTER — Ambulatory Visit: Payer: Medicare HMO | Admitting: Speech Pathology

## 2020-11-26 ENCOUNTER — Ambulatory Visit
Admission: RE | Admit: 2020-11-26 | Discharge: 2020-11-26 | Disposition: A | Discharge status: Home / Self Care | Payer: Medicare HMO | Source: Ambulatory Visit | Attending: Radiation Oncology | Admitting: Radiation Oncology

## 2020-11-26 ENCOUNTER — Other Ambulatory Visit: Payer: Self-pay

## 2020-11-26 DIAGNOSIS — R471 Dysarthria and anarthria: Secondary | ICD-10-CM

## 2020-11-26 DIAGNOSIS — Z9049 Acquired absence of other specified parts of digestive tract: Secondary | ICD-10-CM

## 2020-11-26 DIAGNOSIS — Z510 Encounter for antineoplastic radiation therapy: Secondary | ICD-10-CM | POA: Diagnosis not present

## 2020-11-26 NOTE — Patient Instructions (Signed)
Use Text to Speech app to free text responses in an effort for improved automaticity with app

## 2020-11-26 NOTE — Therapy (Signed)
[place] MAIN [facility] SERVICES [address] [place], [place], [address] Phone: [phone]   Fax:  [phone]  Speech Language Pathology Treatment  Patient Details  Name: Ronnie Buckley MRN: [id number] Date of Birth: 25-Apr-1951 Referring Provider (SLP): Noreene Filbert   Encounter Date: 11/26/2020   End of Session - 11/26/20 1619    Visit Number 7    Number of Visits 13    Date for SLP Re-Evaluation 12/15/20    Authorization Type [employer] Medicare    Authorization Time Period 11/03/2020 thru 12/15/2020    Authorization - Visit Number 7    Progress Note Due on Visit 10    SLP Start Time 0800    SLP Stop Time  0900    SLP Time Calculation (min) 60 min    Activity Tolerance Patient tolerated treatment well           Past Medical History:  Diagnosis Date  . Actinic keratosis 03/03/2010   left dorsum hand, bx proven    No past surgical history on file.  There were no vitals filed for this visit.   Subjective Assessment - 11/26/20 1617    Subjective pt continues to exhibit a pleasant attitude    Currently in Pain? No/denies              ST  CLINIC   [place]   NOTE          Treatment Data and Patient's Response to Treatment   Skilled treatment session focused on continued instruction with TEXT TO SPEECH app. SLP introduced feature of text to phone conversation. Pt was able to demonstrate how to access this feature but will require further opportunities to practice it's use. Pt was also modified independent with making phrases favorite and placing them in appropriate folders. For ease of use, SLP introduced putting common phrases such as his name, DOB in the bills folder and comments "I use this device to communicate" in the auto repair folder. Further instruction is required to use app seamlessly as well as copy and paste frequent utterances into different folders.       SLP Education - 11/26/20 1618    Education Details how to use  app for communicating over the phone    Person(s) Educated Patient    Methods Explanation;Demonstration;Verbal cues    Comprehension Verbalized understanding;Returned demonstration;Need further instruction              SLP Long Term Goals - 11/03/20 1706      SLP LONG TERM GOAL #1   Title Pt will utilize multi-modal forms of communication to convey basic wants/needs in 75% of opportunities with various communication partners.    Baseline <25%    Time 6    Period Weeks    Status New    Target Date 12/15/20            Plan - 11/26/20 1619    Speech Therapy Frequency 2x / week    Duration 12 weeks    Treatment/Interventions Pharyngeal strengthening exercises;Internal/external aids;Compensatory techniques;SLP instruction and feedback;Patient/family education    Potential to Achieve Goals Good    SLP Home Exercise Plan provided, see instruction section    Consulted and Agree with Plan of Care Patient           Patient will benefit from skilled therapeutic intervention in order to improve the following deficits and impairments:   Dysarthria and anarthria  S/P glossectomy    Problem List Patient  Active Problem List   Diagnosis Date Noted  . Iron deficiency anemia 10/28/2020  . Goals of care, counseling/discussion 09/08/2020  . Open wound of leg without complication, right, sequela 08/23/2020  . Oropharyngeal dysphagia 08/23/2020  . S/P glossectomy 08/23/2020  . 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 Hypercholesterolemia 08/02/2020  . Hypertension 08/02/2020  . Tongue cancer ([place]) 07/08/2020  . Type 2 diabetes mellitus with hyperglycemia, without long-term current use of insulin ([place]) 10/24/2018  . Primary osteoarthritis of both knees 12/02/2015   Olimpia Tinch B. Rutherford Nail M.S., CCC-SLP, [place] Pathologist Rehabilitation Services Office [phone]   Stormy Fabian 11/26/2020, 4:20 PM  [place] MAIN [facility]  SERVICES [address] [place], [place], [address] Phone: [phone]   Fax:  [phone]   Name: ADAN BAEHR MRN: [id number] Date of Birth: 12-24-1950

## 2020-11-29 ENCOUNTER — Ambulatory Visit
Admission: RE | Admit: 2020-11-29 | Discharge: 2020-11-29 | Disposition: A | Discharge status: Home / Self Care | Payer: Medicare HMO | Source: Ambulatory Visit | Attending: Radiation Oncology | Admitting: Radiation Oncology

## 2020-11-29 DIAGNOSIS — Z510 Encounter for antineoplastic radiation therapy: Secondary | ICD-10-CM | POA: Diagnosis not present

## 2020-11-30 ENCOUNTER — Encounter: Payer: Self-pay | Admitting: Oncology

## 2020-11-30 ENCOUNTER — Inpatient Hospital Stay (HOSPITAL_BASED_OUTPATIENT_CLINIC_OR_DEPARTMENT_OTHER): Payer: Medicare HMO | Admitting: Oncology

## 2020-11-30 ENCOUNTER — Ambulatory Visit
Admission: RE | Admit: 2020-11-30 | Discharge: 2020-11-30 | Disposition: A | Discharge status: Home / Self Care | Payer: Medicare HMO | Source: Ambulatory Visit | Attending: Radiation Oncology | Admitting: Radiation Oncology

## 2020-11-30 ENCOUNTER — Inpatient Hospital Stay: Payer: Medicare HMO

## 2020-11-30 ENCOUNTER — Inpatient Hospital Stay: Payer: Medicare HMO | Attending: Oncology

## 2020-11-30 VITALS — BP 115/64 | HR 68 | Temp 96.1°F | Wt 189.0 lb

## 2020-11-30 VITALS — BP 119/60 | HR 64 | Resp 16

## 2020-11-30 DIAGNOSIS — C029 Malignant neoplasm of tongue, unspecified: Secondary | ICD-10-CM | POA: Insufficient documentation

## 2020-11-30 DIAGNOSIS — Z510 Encounter for antineoplastic radiation therapy: Secondary | ICD-10-CM | POA: Insufficient documentation

## 2020-11-30 DIAGNOSIS — D509 Iron deficiency anemia, unspecified: Secondary | ICD-10-CM | POA: Diagnosis not present

## 2020-11-30 DIAGNOSIS — K1233 Oral mucositis (ulcerative) due to radiation: Secondary | ICD-10-CM | POA: Diagnosis not present

## 2020-11-30 DIAGNOSIS — Z5111 Encounter for antineoplastic chemotherapy: Secondary | ICD-10-CM

## 2020-11-30 DIAGNOSIS — Z87891 Personal history of nicotine dependence: Secondary | ICD-10-CM | POA: Insufficient documentation

## 2020-11-30 DIAGNOSIS — D508 Other iron deficiency anemias: Secondary | ICD-10-CM

## 2020-11-30 DIAGNOSIS — C778 Secondary and unspecified malignant neoplasm of lymph nodes of multiple regions: Secondary | ICD-10-CM | POA: Insufficient documentation

## 2020-11-30 LAB — CBC WITH DIFFERENTIAL/PLATELET
Abs Immature Granulocytes: 0.01 10*3/uL (ref 0.00–0.07)
Basophils Absolute: 0 10*3/uL (ref 0.0–0.1)
Basophils Relative: 0 %
Eosinophils Absolute: 0 10*3/uL (ref 0.0–0.5)
Eosinophils Relative: 1 %
HCT: 32.4 % — ABNORMAL LOW (ref 39.0–52.0)
Hemoglobin: 10.7 g/dL — ABNORMAL LOW (ref 13.0–17.0)
Immature Granulocytes: 0 %
Lymphocytes Relative: 16 %
Lymphs Abs: 0.4 10*3/uL — ABNORMAL LOW (ref 0.7–4.0)
MCH: 28.9 pg (ref 26.0–34.0)
MCHC: 33 g/dL (ref 30.0–36.0)
MCV: 87.6 fL (ref 80.0–100.0)
Monocytes Absolute: 0.3 10*3/uL (ref 0.1–1.0)
Monocytes Relative: 9 %
Neutro Abs: 2.1 10*3/uL (ref 1.7–7.7)
Neutrophils Relative %: 74 %
Platelets: 159 10*3/uL (ref 150–400)
RBC: 3.7 MIL/uL — ABNORMAL LOW (ref 4.22–5.81)
RDW: 17.2 % — ABNORMAL HIGH (ref 11.5–15.5)
WBC: 2.8 10*3/uL — ABNORMAL LOW (ref 4.0–10.5)
nRBC: 0 % (ref 0.0–0.2)

## 2020-11-30 LAB — BASIC METABOLIC PANEL
Anion gap: 11 (ref 5–15)
BUN: 29 mg/dL — ABNORMAL HIGH (ref 8–23)
CO2: 26 mmol/L (ref 22–32)
Calcium: 9 mg/dL (ref 8.9–10.3)
Chloride: 100 mmol/L (ref 98–111)
Creatinine, Ser: 0.65 mg/dL (ref 0.61–1.24)
GFR, Estimated: >60 mL/min (ref >60)
Glucose, Bld: 91 mg/dL (ref 70–99)
Potassium: 4.6 mmol/L (ref 3.5–5.1)
Sodium: 137 mmol/L (ref 135–145)

## 2020-11-30 LAB — MAGNESIUM: Magnesium: 2.2 mg/dL (ref 1.7–2.4)

## 2020-11-30 MED ORDER — HEPARIN SOD (PORK) LOCK FLUSH 100 UNIT/ML IV SOLN
500.0000 [IU] | Freq: Once | INTRAVENOUS | Status: AC | PRN
  Administered 2020-11-30: 500 [IU]
  Filled 2020-11-30: qty 5

## 2020-11-30 MED ORDER — HEPARIN SOD (PORK) LOCK FLUSH 100 UNIT/ML IV SOLN
INTRAVENOUS | Status: AC
  Filled 2020-11-30: qty 5

## 2020-11-30 MED ORDER — POTASSIUM CHLORIDE IN NACL 20-0.9 MEQ/L-% IV SOLN
Freq: Once | INTRAVENOUS | Status: AC
  Filled 2020-11-30: qty 1000

## 2020-11-30 MED ORDER — PALONOSETRON HCL INJECTION 0.25 MG/5ML
0.2500 mg | Freq: Once | INTRAVENOUS | Status: AC
  Administered 2020-11-30: 0.25 mg via INTRAVENOUS
  Filled 2020-11-30: qty 5

## 2020-11-30 MED ORDER — SODIUM CHLORIDE 0.9 % IV SOLN
40.0000 mg/m2 | Freq: Once | INTRAVENOUS | Status: AC
  Administered 2020-11-30: 84 mg via INTRAVENOUS
  Filled 2020-11-30: qty 84

## 2020-11-30 MED ORDER — SODIUM CHLORIDE 0.9 % IV SOLN: 200.0000 mg | INTRAVENOUS | Status: DC

## 2020-11-30 MED ORDER — SODIUM CHLORIDE 0.9 % IV SOLN
150.0000 mg | Freq: Once | INTRAVENOUS | Status: AC
  Administered 2020-11-30: 150 mg via INTRAVENOUS
  Filled 2020-11-30: qty 150

## 2020-11-30 MED ORDER — HEPARIN SOD (PORK) LOCK FLUSH 100 UNIT/ML IV SOLN
500.0000 [IU] | Freq: Once | INTRAVENOUS | Status: AC
  Filled 2020-11-30: qty 5

## 2020-11-30 MED ORDER — MAGNESIUM SULFATE 2 GM/50ML IV SOLN
2.0000 g | Freq: Once | INTRAVENOUS | Status: AC
  Administered 2020-11-30: 2 g via INTRAVENOUS
  Filled 2020-11-30: qty 50

## 2020-11-30 MED ORDER — SODIUM CHLORIDE 0.9 % IV SOLN
10.0000 mg | Freq: Once | INTRAVENOUS | Status: AC
  Administered 2020-11-30: 10 mg via INTRAVENOUS
  Filled 2020-11-30: qty 10

## 2020-11-30 MED ORDER — SODIUM CHLORIDE 0.9% FLUSH
10.0000 mL | INTRAVENOUS | Status: DC | PRN
  Administered 2020-11-30: 10 mL via INTRAVENOUS
  Filled 2020-11-30: qty 10

## 2020-11-30 MED ORDER — SODIUM CHLORIDE 0.9 % IV SOLN
Freq: Once | INTRAVENOUS | Status: AC
  Filled 2020-11-30: qty 250

## 2020-11-30 MED ORDER — IRON SUCROSE 20 MG/ML IV SOLN
200.0000 mg | Freq: Once | INTRAVENOUS | Status: AC
  Administered 2020-11-30: 200 mg via INTRAVENOUS
  Filled 2020-11-30: qty 10

## 2020-11-30 NOTE — Progress Notes (Signed)
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 Conley Canal for a lesion that he identified on his left tongue. Biopsy on 06/15/2020 was consistent with squamous cell carcinoma. This was followed by a CT neck and chest. CT neck showed a large poorly defined infiltrative mass throughout the posterior left oral tongue and left oral base extending to the left glossotonsillar sulcus. The mass crosses midline through the lingual septum into the right hemitongue. Findings are concerning for left lateral retropharyngeal as well as level 1B283 and 4 nodal metastases. CT chest without contrast did not show any evidence of metastatic disease. Patient then underwent total glossectomy with bilateral neck dissection on 07/15/2020.  Pathology showed:Surgical Pathology Final Pathologic Diagnosis  A. LYMPH NODE, LEFT LEVEL 1B, DISSECTION: Benign salivary gland tissue. One out of six lymph nodes, positive for metastatic carcinoma (1/6).  B. LYMPH NODES, LEFT LEVEL 2A, 3, &4, DISSECTION: Sixteen lymph nodes, negative for metastatic carcinoma (0/16).  C. LYMPH NODES, LEFT LEVEL 2B, DISSECTION: Seven lymph nodes, negative for metastatic carcinoma (0/7).  D. LYMPH NODES, LEFT LEVEL 1A, DISSECTION: One lymph node, negative for metastatic carcinoma (0/1).  E. LYMPH NODES, RIGHT LEVEL 2A, 3, &4, DISSECTION: Eleven lymph nodes, negative for metastatic carcinoma (0/11).  F. LYMPH NODES,  RIGHT LEVEL 2A AND 2B, DISSECTION: Eleven lymph nodes, negative for metastatic carcinoma(0/11).  G. LINGUAL NERVE, RIGHT, EXCISION: Benign nerve tissue. No malignancy identified.  H. LYMPH NODES, RIGHT LEVEL 1B, DISSECTION: Benign salivary gland tissue. Three lymph nodes, negative for metastatic carcinoma. (0/3).  I. LINGUAL NERVE, PROXIMAL LEFT MARGIN, EXCISION: Benign peripheral nerve. No malignancy identified.  J. FLOOR OF MOUTH, LEFT MARGIN, EXCISION: Benign oropharyngeal mucosa. No malignancy identified.  K. SOFT PALATE, LEFT MARGIN, EXCISION: Benign oropharyngeal mucosa. No malignancy identified.  L. PHARYNGEAL MUCOSAL MARGIN, LEFT, EXCISION: Benign oropharyngeal mucosa. No malignancy identified.  M. VALLECULA MUCOSA MARGIN, EXCISION: Benign oropharyngeal mucosa. No malignancy identified.  N. PTERYGOID, DEEP TONSILLAR FOSSA MARGIN, EXCISION: Benign soft tissue. No malignancy identified.  O. TONGUE, TOTAL GLOSSECTOMY: Invasive moderately differentiated squamous cell carcinoma, keratinizing (4.6 cm). Tumor focally present at the deep and left tissue edges, please see separated margin status.  Lymphovascular invasion identified.  Perineural invasion is identified. Pathologic stage: pT4a pN1.  See CAP synoptic report.   Patient was evaluated by radiation oncology at [facility] and plan was to offer adjuvant concurrent chemoradiation. However patient lives in [place] and prefers to get care locally.Plan is for concurrent chemoradiation which was delayed due to dental clearance prior to starting radiation treatment. He also underwent tracheostomy removal, port placement and PEG tube placement at [facility]   Interval history-reports some soreness in his mouth for which she is using Magic mouthwash.  He has not used any narcotic medication so far.  He is also working with speech therapy.  Reports no problems with PEG tube.  Denies any tingling  numbness in his extremities  ECOG PS- 1 Pain scale- 0 Opioid associated constipation- no  Review of systems- Review of Systems  Constitutional: Negative for chills, fever, malaise/fatigue and weight loss.  HENT: Negative for congestion, ear discharge and nosebleeds.   Eyes: Negative for blurred vision.  Respiratory: Negative for cough, hemoptysis, sputum production, shortness of breath and wheezing.   Cardiovascular: Negative for chest pain, palpitations, orthopnea and claudication.  Gastrointestinal: Negative for abdominal pain, blood in stool, constipation, diarrhea, heartburn, melena, nausea and vomiting.  Genitourinary: Negative for dysuria, flank pain, frequency, hematuria and urgency.  Musculoskeletal: Negative for back pain, joint pain and myalgias.  Skin: Negative for rash.  Neurological: Negative for dizziness, tingling, focal weakness, seizures, weakness and headaches.  Endo/Heme/Allergies: Does not bruise/bleed easily.  Psychiatric/Behavioral: Negative for depression and suicidal ideas. The patient does not have insomnia.       Allergies  Allergen Reactions  . Tape     Redness and bleeding with some tapes and adhesives.     Past Medical History:  Diagnosis Date  . Actinic keratosis 03/03/2010   left dorsum hand, bx proven     No past surgical history on file.  Social History   Socioeconomic History  . Marital status: Married    Spouse name: Not on file  . Number of children: Not on file  . Years of education: Not on file  . Highest education level: Not on file  Occupational History  . Not on file  Tobacco Use  . Smoking status: Former Smoker    Types: Cigarettes  . Smokeless tobacco: Never Used  . Tobacco comment: 60 years ago  Vaping Use  . Vaping Use: Never used  Substance and Sexual Activity  . Alcohol use: Not Currently  . Drug use: Never  . Sexual activity: Not Currently  Other Topics Concern  . Not on file  Social History Narrative  . Not  on file   Social Determinants of Health   Financial Resource Strain: Not on file  Food Insecurity: Not on file  Transportation Needs: Not on file  Physical Activity: Not on file  Stress: Not on file  Social Connections: Not on file  Intimate Partner Violence: Not on file    No family history on file.   Current Outpatient Medications:  .  acetaminophen (TYLENOL) 500 MG tablet, Take 2 tablets by mouth every 6 (six) hours as needed., Disp: , Rfl:  .  Ascorbic Acid (VITAMIN C) 1000 MG tablet, Take 1,000 mg by mouth daily., Disp: , Rfl:  .  aspirin 325 MG tablet, Take 1 tablet by mouth in the morning and at bedtime. (Patient not taking: No sig reported), Disp: , Rfl:  .  aspirin EC 81 MG tablet, Take 81 mg by mouth daily. Swallow whole., Disp: , Rfl:  .  dexamethasone (DECADRON) 4 MG tablet, Take 1 tablet (4 mg total) by mouth 2 (two) times daily with a meal. Take 2 tablets Daily for 3 days, starting the day After cisplatin chemotherapy, Disp: 30 tablet, Rfl: 1 .  enalapril (VASOTEC) 10 MG tablet, Take 10 mg by mouth daily., Disp: , Rfl:  .  glipiZIDE (GLUCOTROL) 10 MG tablet, Take 10 mg by mouth daily before breakfast., Disp: , Rfl:  .  lidocaine-prilocaine (EMLA) cream, Apply 1 application topically as directed. aplly small amount of cream over the port 1 1/2 hours before each treatment, place saran wrap over cream to protect the clothing, Disp: 30 g, Rfl: 2 .  LORazepam (ATIVAN) 0.5 MG tablet, Take 1 tablet (0.5 mg total) by mouth every 8 (eight) hours as needed for anxiety., Disp: 30  tablet, Rfl: 0 .  magic mouthwash w/lidocaine SOLN, Take 5 mLs by mouth 4 (four) times daily as needed for mouth pain., Disp: 480 mL, Rfl: 1 .  metFORMIN (GLUCOPHAGE) 500 MG tablet, Take 1,000 mg by mouth 2 (two) times daily., Disp: , Rfl:  .  Multiple Vitamin (MULTI-VITAMIN) tablet, Take 1 tablet by mouth daily., Disp: , Rfl:  .  ondansetron (ZOFRAN) 8 MG tablet, Take 1 tablet (8 mg total) by mouth every 8  (eight) hours as needed for nausea or vomiting. Start on the 3rd day after chemotherapy, Disp: 20 tablet, Rfl: 0 .  pravastatin (PRAVACHOL) 40 MG tablet, Take 1 tablet by mouth daily., Disp: , Rfl:  .  prochlorperazine (COMPAZINE) 10 MG tablet, Take 1 tablet (10 mg total) by mouth every 6 (six) hours as needed for nausea or vomiting., Disp: 30 tablet, Rfl: 0 .  sucralfate (CARAFATE) 1 g tablet, Take 0.5 tablets (0.5 g total) by mouth 3 (three) times daily. Dissolve in 3-4 tbsp warm water, swish and swallow., Disp: 60 tablet, Rfl: 1 No current facility-administered medications for this visit.  Facility-Administered Medications Ordered in Other Visits:  .  heparin lock flush 100 unit/mL, 500 Units, Intravenous, Once, Sindy Guadeloupe, MD .  sodium chloride flush (NS) 0.9 % injection 10 mL, 10 mL, Intravenous, PRN, Sindy Guadeloupe, MD, 10 mL at 11/30/20 0825  Physical exam:  Vitals:   11/30/20 0845  BP: 115/64  Pulse: 68  Temp: (!) 96.1 F (35.6 C)  TempSrc: Tympanic  SpO2: 99%  Weight: 189 lb (85.7 kg)   Physical Exam Constitutional:      Comments: Speech is garbled secondary to glossectomy and reconstruction  HENT:     Mouth/Throat:     Comments: Grade 1 mucositis noted Eyes:     Extraocular Movements: EOM normal.  Cardiovascular:     Rate and Rhythm: Normal rate and regular rhythm.     Heart sounds: Normal heart sounds.  Pulmonary:     Effort: Pulmonary effort is normal.     Breath sounds: Normal breath sounds.  Abdominal:     General: Bowel sounds are normal.     Palpations: Abdomen is soft.     Comments: PEG tube in place  Skin:    General: Skin is warm and dry.  Neurological:     Mental Status: He is alert and oriented to person, place, and time.      CMP Latest Ref Rng & Units 11/23/2020  Glucose 70 - 99 mg/dL 104(H)  BUN 8 - 23 mg/dL 32(H)  Creatinine 0.61 - 1.24 mg/dL 0.60(L)  Sodium 135 - 145 mmol/L 136  Potassium 3.5 - 5.1 mmol/L 4.4  Chloride 98 - 111 mmol/L  100  CO2 22 - 32 mmol/L 26  Calcium 8.9 - 10.3 mg/dL 8.6(L)   CBC Latest Ref Rng & Units 11/30/2020  WBC 4.0 - 10.5 K/uL 2.8(L)  Hemoglobin 13.0 - 17.0 g/dL 10.7(L)  Hematocrit 39.0 - 52.0 % 32.4(L)  Platelets 150 - 400 K/uL 159     Assessment and plan- Patient is a 70 y.o. 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 He is here for on treatment assessment prior to cycle 6 of weekly cisplatin chemotherapy  Counts okay to proceed with cycle 6 of weekly cisplatin chemotherapy today.  He has mild leukopenia with a white count of 2.8 and ANC of 2.2.  I will hold off  on giving him any Neupogen at this time since this is his last chemotherapy.  This will be his last cycle since he completes radiation on 12/03/2020.  Normocytic anemia: Likely a component of iron deficiency as well and he has received 3 doses of Venofer so far.  He will receive his fourth dose today.  I will plan to see him back in 6 weeks time with CBC with differential, CMP ferritin and iron studies B12 and folate.  Plan to get repeat PET CT scan 3 months from now.  Radiation mucositis: Mild grade 1.  Continue to monitor   Visit Diagnosis 1. Encounter for antineoplastic chemotherapy   2. Tongue cancer ([place])   3. Mucositis due to radiation therapy      Dr. Randa Evens, MD, MPH [facility] at [facility] [phone] 11/30/2020 9:33 AM

## 2020-12-01 ENCOUNTER — Ambulatory Visit: Payer: Medicare HMO | Attending: Radiation Oncology | Admitting: Speech Pathology

## 2020-12-01 ENCOUNTER — Ambulatory Visit
Admission: RE | Admit: 2020-12-01 | Discharge: 2020-12-01 | Disposition: A | Discharge status: Home / Self Care | Payer: Medicare HMO | Source: Ambulatory Visit | Attending: Radiation Oncology | Admitting: Radiation Oncology

## 2020-12-01 ENCOUNTER — Other Ambulatory Visit: Payer: Self-pay

## 2020-12-01 DIAGNOSIS — R1312 Dysphagia, oropharyngeal phase: Secondary | ICD-10-CM | POA: Diagnosis present

## 2020-12-01 DIAGNOSIS — R471 Dysarthria and anarthria: Secondary | ICD-10-CM | POA: Insufficient documentation

## 2020-12-01 DIAGNOSIS — Z9049 Acquired absence of other specified parts of digestive tract: Secondary | ICD-10-CM | POA: Diagnosis present

## 2020-12-01 DIAGNOSIS — Z510 Encounter for antineoplastic radiation therapy: Secondary | ICD-10-CM | POA: Diagnosis not present

## 2020-12-01 NOTE — Therapy (Signed)
[place] MAIN [facility] SERVICES [address] [place], [place], [address] Phone: [phone]   Fax:  [phone]  Speech Language Pathology Treatment  Patient Details  Name: Ronnie Buckley MRN: [id number] Date of Birth: Feb 07, 1951 Referring Provider (SLP): Noreene Filbert   Encounter Date: 12/01/2020   End of Session - 12/01/20 1013    Visit Number 8    Number of Visits 13    Date for SLP Re-Evaluation 12/15/20    Authorization Type [employer]etna Medicare    Authorization Time Period 11/03/2020 thru 12/15/2020    Authorization - Visit Number 8    Progress Note Due on Visit 10    SLP Start Time 0815    SLP Stop Time  0900    SLP Time Calculation (min) 45 min    Activity Tolerance Patient tolerated treatment well           Past Medical History:  Diagnosis Date  . Actinic keratosis 03/03/2010   left dorsum hand, bx proven    No past surgical history on file.  There were no vitals filed for this visit.   Subjective Assessment - 12/01/20 1012    Subjective pt pleasant, reports he is not feeling well after last infusion yesterday    Currently in Pain? No/denies           ST  CLINIC   [place]   NOTE          Treatment Data and Patient's Response to Treatment     Skilled ST treatment session targeted continued implementation of "Text to Speech!" app. Pt arrived to session stating he was not feeling well from his infusion yesterday. Pt reported he used the communication app at [facility] and had no problems. Pt with continued coughing and throat clears throughout session. SLP faciliated session by creating folders specifically to be used for phone calls for doctor's appointments to reduce wait time during conversations. Minimal assistance was required when adding phrases ("I would like to cancel an appointment" and "No, I am not available") to a folder and sorting the phrases within a folder. SLP and pt then faciliated a fake telephone conversation  about scheduling a doctor's appointment. Pt was independent at selecting appropriate messages and creating a separate spontaneous message ("I have to type my responses. Please be patient"). Patient has made great progress in using the app appropriately; however, further ST intervention is needed to increase pt's ease of access and using the app while engaged in spontaneous conversation.        SLP Education - 12/01/20 1013    Education Details creating folders to use for phone conversations    Person(s) Educated Patient    Methods Explanation;Demonstration;Verbal cues    Comprehension Verbalized understanding;Returned demonstration;Need further instruction              SLP Long Term Goals - 11/03/20 1706      SLP LONG TERM GOAL #1   Title Pt will utilize multi-modal forms of communication to convey basic wants/needs in 75% of opportunities with various communication partners.    Baseline <25%    Time 6    Period Weeks    Status New    Target Date 12/15/20            Plan - 12/01/20 1014        Speech Therapy Frequency 2x / week    Duration 12 weeks    Treatment/Interventions Pharyngeal strengthening exercises;Internal/external aids;Compensatory techniques;SLP instruction and feedback;Patient/family  education    Potential to Achieve Goals Good    Potential Considerations Severity of impairments    Consulted and Agree with Plan of Care Patient           Patient will benefit from skilled therapeutic intervention in order to improve the following deficits and impairments:   Dysarthria and anarthria  S/P glossectomy    Problem List Patient Active Problem List   Diagnosis Date Noted  . Iron deficiency anemia 10/28/2020  . Goals of care, counseling/discussion 09/08/2020  . Open wound of leg without complication, right, sequela 08/23/2020  . Oropharyngeal dysphagia 08/23/2020  . S/P glossectomy 08/23/2020  . 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  Hypercholesterolemia 08/02/2020  . Hypertension 08/02/2020  . Tongue cancer ([place]) 07/08/2020  . Type 2 diabetes mellitus with hyperglycemia, without long-term current use of insulin ([place]) 10/24/2018  . Primary osteoarthritis of both knees 12/02/2015   Gisel Vipond B. Rutherford Nail M.S., CCC-SLP, [place] Pathologist Rehabilitation Services Office [phone]  Stormy Fabian 12/01/2020, 3:35 PM  [place] MAIN [facility] SERVICES [address] [place], [place], [address] Phone: [phone]   Fax:  [phone]   Name: KAYLEB WARSHAW MRN: [id number] Date of Birth: [birth year]

## 2020-12-02 ENCOUNTER — Inpatient Hospital Stay: Payer: Medicare HMO

## 2020-12-02 ENCOUNTER — Ambulatory Visit
Admission: RE | Admit: 2020-12-02 | Discharge: 2020-12-02 | Disposition: A | Discharge status: Home / Self Care | Payer: Medicare HMO | Source: Ambulatory Visit | Attending: Radiation Oncology | Admitting: Radiation Oncology

## 2020-12-02 DIAGNOSIS — Z510 Encounter for antineoplastic radiation therapy: Secondary | ICD-10-CM | POA: Diagnosis not present

## 2020-12-02 NOTE — Progress Notes (Signed)
Nutrition Follow-up:  Patient with stage IV tongue cancer followed by Dr. Janese Banks.  S/p total glossectomy with bilateral neck dissection on 07/15/20 at [facility].  PEG placed at [facility] on 09/22/20.  Patient completed chemotherapy on 3/1 and last radiation due 3/4.   Met with patient following radiation.  Patient reports that everything is the same with tube feeding regimen. Continues with 6 cartons of glucerna 1.5 daily (2 cartons in AM, 2 cartons at lunch time plus 1 prostat and 2 cartons at 6pm plus 1 prostat). Patient giving water between feedings. Switched to 3 feedings because did not want to give feeding later on in the evening due to full feeling. Goes to bed around 9-9:30pm.  Reports bowel movement about every 3 days. No issues with nausea.   Reports magic mouthwash is helping with pain in mouth.  Sipping water and eating ice chips. Followed by SLP.    Medications: reviewed  Labs: reviewed  Anthropometrics:   Weight 189 lb on 3/1 (medical oncology)  190 lb 6 oz radiation on 2/24 188 lb 2/22 (med oncology) 196 lb 4 oz on 2/9 191 lb on 1/24   Estimated Energy Needs  Kcals: 2250-2700 Protein: 112-135 g Fluid: 2.2 L  NUTRITION DIAGNOSIS: Inadequate oral intake but relying on feeding tube   INTERVENTION:  Patient wants to hold off on increasing glucerna by half carton or full carton.  Continue to monitor weight, if drops recommend increasing to 7 cartons of glucerna 1.5 per day.  Patient has contact information    MONITORING, EVALUATION, GOAL: weight trends, tube feeding   NEXT VISIT: March 31 after MD appointment  Joli B. Zenia Resides, [place], [place] Registered Dietitian [phone] (mobile)

## 2020-12-03 ENCOUNTER — Ambulatory Visit: Payer: Medicare HMO | Admitting: Speech Pathology

## 2020-12-03 ENCOUNTER — Other Ambulatory Visit: Payer: Self-pay

## 2020-12-03 ENCOUNTER — Ambulatory Visit
Admission: RE | Admit: 2020-12-03 | Discharge: 2020-12-03 | Disposition: A | Discharge status: Home / Self Care | Payer: Medicare HMO | Source: Ambulatory Visit | Attending: Radiation Oncology | Admitting: Radiation Oncology

## 2020-12-03 DIAGNOSIS — R471 Dysarthria and anarthria: Secondary | ICD-10-CM

## 2020-12-03 DIAGNOSIS — Z9049 Acquired absence of other specified parts of digestive tract: Secondary | ICD-10-CM

## 2020-12-03 DIAGNOSIS — Z510 Encounter for antineoplastic radiation therapy: Secondary | ICD-10-CM | POA: Diagnosis not present
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
## 2020-12-03 NOTE — Patient Instructions (Signed)
Continue using TEXT to SPEECH app; especially during spontaneous interactions

## 2020-12-06 ENCOUNTER — Ambulatory Visit: Payer: Medicare HMO | Admitting: Speech Pathology

## 2020-12-08 ENCOUNTER — Other Ambulatory Visit: Payer: Self-pay

## 2020-12-08 ENCOUNTER — Ambulatory Visit: Payer: Medicare HMO | Admitting: Speech Pathology

## 2020-12-08 DIAGNOSIS — R471 Dysarthria and anarthria: Secondary | ICD-10-CM | POA: Diagnosis not present

## 2020-12-08 DIAGNOSIS — Z9049 Acquired absence of other specified parts of digestive tract: Secondary | ICD-10-CM

## 2020-12-08 NOTE — Therapy (Signed)
[place] MAIN [facility] SERVICES [address] [place], [place], [address] Phone: [phone]   Fax:  [phone]  Speech Language Pathology Treatment  Patient Details  Name: Ronnie Buckley MRN: [id number] Date of Birth: 01-23-1951 Referring Provider (SLP): Noreene Filbert   Encounter Date: 12/08/2020   End of Session - 12/08/20 0951    Visit Number 10    Number of Visits 18    Date for SLP Re-Evaluation 02/02/21    Authorization Type Aetna Medicare    Authorization Time Period 12/08/2020 thru 02/02/2021    Authorization - Visit Number 10    Progress Note Due on Visit 10    SLP Start Time 0900    SLP Stop Time  0945    SLP Time Calculation (min) 45 min    Activity Tolerance Patient tolerated treatment well           Past Medical History:  Diagnosis Date  . Actinic keratosis 03/03/2010   left dorsum hand, bx proven    No past surgical history on file.  There were no vitals filed for this visit.   Subjective Assessment - 12/08/20 0950    Subjective Pt states that he feeling better    Currently in Pain? No/denies            ST  CLINIC   TX   NOTE          Treatment Data and Patient's Response to Treatment    Pt stated that he is feeling better and having more energy each day. During this session, his speech is more intelligible > 75%. He reports that he is comfortable with his TEXT to [facility] app and uses it when needed. Pt had questions about his swallowing. As pt spoke, we created an email to his SLP at [facility] asking for direction as to when another instrumental study would be indicated. When I receive a response, we agreed that I would text pt. He independently utilized his app to communicate his cell phone. At this time, it is appropriate to reduce pt's ST frequency to 1 x week with discharged planned after completing any dysphagia treatment.   1259 - Response received after session that advised pt to be at least 4  weeks s/p radiation.       SLP Education - 12/08/20 [phone]    Education Details provided    Person(s) Educated Patient    Methods Explanation    Comprehension Verbalized understanding              SLP Long Term Goals - 12/08/20 0957      SLP LONG TERM GOAL #1   Title Pt will utilize multi-modal forms of communication to convey basic wants/needs in 75% of opportunities with various communication partners.    Time 8    Period Weeks    Status Partially Met    Target Date 02/02/21            Plan - 12/08/20 0954    Speech Therapy Frequency 1x /week    Duration 8 weeks    Treatment/Interventions Pharyngeal strengthening exercises;Internal/external aids;Compensatory techniques;SLP instruction and feedback;Patient/family education    Potential to Achieve Goals Good    Potential Considerations Severity of impairments    SLP Home Exercise Plan provided, see instruction section    Consulted and Agree with Plan of Care Patient           Patient will benefit from skilled therapeutic intervention in order  to improve the following deficits and impairments:   Dysarthria and anarthria  S/P glossectomy    Problem List Patient Active Problem List   Diagnosis Date Noted  . Iron deficiency anemia 10/28/2020  . Goals of care, counseling/discussion 09/08/2020  . Open wound of leg without complication, right, sequela 08/23/2020  . Oropharyngeal dysphagia 08/23/2020  . S/P glossectomy 08/23/2020  . 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 Hypercholesterolemia 08/02/2020  . Hypertension 08/02/2020  . Tongue cancer ([place]) 07/08/2020  . Type 2 diabetes mellitus with hyperglycemia, without long-term current use of insulin ([place]) 10/24/2018  . Primary osteoarthritis of both knees 12/02/2015   Happi B. Rutherford Nail M.S., CCC-SLP, [place] Pathologist Rehabilitation Services Office [phone]  Stormy Fabian 12/08/2020, 9:58 AM  [place] MAIN [facility] SERVICES [address] [place], [place], [address] Phone: [phone]   Fax:  [phone]   Name: Ronnie Buckley MRN: [id number] Date of Birth: 08-16-1951

## 2020-12-08 NOTE — Patient Instructions (Signed)
Continue pharyngeal strengthening exercises; utilizing TEXT to SPEECH app

## 2020-12-10 ENCOUNTER — Ambulatory Visit: Payer: Medicare HMO | Admitting: Speech Pathology

## 2020-12-15 ENCOUNTER — Ambulatory Visit: Payer: Medicare HMO | Admitting: Speech Pathology

## 2020-12-15 ENCOUNTER — Other Ambulatory Visit: Payer: Self-pay

## 2020-12-15 DIAGNOSIS — R1312 Dysphagia, oropharyngeal phase: Secondary | ICD-10-CM

## 2020-12-15 DIAGNOSIS — Z9049 Acquired absence of other specified parts of digestive tract: Secondary | ICD-10-CM

## 2020-12-15 DIAGNOSIS — R471 Dysarthria and anarthria: Secondary | ICD-10-CM | POA: Diagnosis not present

## 2020-12-15 NOTE — Therapy (Signed)
[place] MAIN [facility] SERVICES [address] [place], [place], [address] Phone: [phone]   Fax:  [phone]  Speech Language Pathology Treatment  Patient Details  Name: Ronnie Buckley MRN: [id number] Date of Birth: Feb 22, 1951 Referring Provider (SLP): Noreene Filbert   Encounter Date: 12/15/2020   End of Session - 12/15/20 1436    Visit Number 11    Number of Visits 18    Date for SLP Re-Evaluation 02/02/21    Authorization Type Aetna Medicare    Authorization Time Period 12/08/2020 thru 02/02/2021    Authorization - Visit Number 1    Progress Note Due on Visit 10    SLP Start Time 0903    SLP Stop Time  0935    SLP Time Calculation (min) 32 min    Activity Tolerance Patient tolerated treatment well           Past Medical History:  Diagnosis Date  . Actinic keratosis 03/03/2010   left dorsum hand, bx proven    No past surgical history on file.  There were no vitals filed for this visit.   Subjective Assessment - 12/15/20 1435    Subjective "I got your email, but knew that I was coming in today"    Currently in Pain? No/denies            ST  CLINIC   TX   NOTE          Treatment Data and Patient's Response to Treatment   Pt arrived to session and stated, "I got your email but I knew I was coming in today." Pt's speech intelligibility was severely decreased today, may be related to oral secretions. SLP reviewed information conveyed in email - recommend waiting at least 4 weeks after his radiation before performing an instrumental swallow study. Education also provided on POC to place pt on hold, request an order for the MBSS and then schedule MBSS with this therapist. SLP will reach out to pt via text to keep him updated. Pt didn't have any further questions about the Text to Speech app. Pt is in agreement with current POC.         SLP Education - 12/15/20 1436    Education Details current POC    Person(s) Educated  Patient    Methods Explanation    Comprehension Verbalized understanding              SLP Long Term Goals - 12/08/20 0957      SLP LONG TERM GOAL #1   Title Pt will utilize multi-modal forms of communication to convey basic wants/needs in 75% of opportunities with various communication partners.    Time 8    Period Weeks    Status Partially Met    Target Date 02/02/21            Plan - 12/15/20 1437    Speech Therapy Frequency --   hold for 2-3 weeks until MBSS completed   Duration --   hold for 2-3 weeks until MBSS completed   Treatment/Interventions Pharyngeal strengthening exercises;Internal/external aids;Compensatory techniques;SLP instruction and feedback;Patient/family education    Potential Considerations Severity of impairments    Consulted and Agree with Plan of Care Patient           Patient will benefit from skilled therapeutic intervention in order to improve the following deficits and impairments:   Dysphagia, oropharyngeal phase  Dysarthria and anarthria  Oropharyngeal dysphagia  S/P glossectomy    Problem  List Patient Active Problem List   Diagnosis Date Noted  . Iron deficiency anemia 10/28/2020  . Goals of care, counseling/discussion 09/08/2020  . Open wound of leg without complication, right, sequela 08/23/2020  . Oropharyngeal dysphagia 08/23/2020  . S/P glossectomy 08/23/2020  . 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 Hypercholesterolemia 08/02/2020  . Hypertension 08/02/2020  . Tongue cancer ([place]) 07/08/2020  . Type 2 diabetes mellitus with hyperglycemia, without long-term current use of insulin ([place]) 10/24/2018  . Primary osteoarthritis of both knees 12/02/2015   Happi B. Rutherford Nail M.S., CCC-SLP, [facility] Office [phone]  Stormy Fabian 12/15/2020, 2:39 PM  [place] MAIN [facility] SERVICES [address] [place], [place], [address] Phone:  [phone]   Fax:  [phone]   Name: Ronnie Buckley MRN: [id number] Date of Birth: March 10, 1951

## 2020-12-17 ENCOUNTER — Ambulatory Visit: Payer: Medicare HMO | Admitting: Speech Pathology

## 2020-12-21 ENCOUNTER — Other Ambulatory Visit: Payer: Self-pay | Admitting: *Deleted

## 2020-12-21 DIAGNOSIS — C029 Malignant neoplasm of tongue, unspecified: Secondary | ICD-10-CM

## 2020-12-21 DIAGNOSIS — Z9049 Acquired absence of other specified parts of digestive tract: Secondary | ICD-10-CM

## 2020-12-22 ENCOUNTER — Ambulatory Visit: Payer: Medicare HMO | Admitting: Speech Pathology

## 2020-12-24 ENCOUNTER — Ambulatory Visit: Payer: Medicare HMO | Admitting: Speech Pathology

## 2020-12-29 ENCOUNTER — Ambulatory Visit: Payer: Medicare HMO | Admitting: Speech Pathology

## 2020-12-30 ENCOUNTER — Other Ambulatory Visit: Payer: Self-pay

## 2020-12-30 ENCOUNTER — Inpatient Hospital Stay: Payer: Medicare HMO

## 2020-12-30 ENCOUNTER — Ambulatory Visit
Admission: RE | Admit: 2020-12-30 | Discharge: 2020-12-30 | Disposition: A | Discharge status: Home / Self Care | Payer: Medicare HMO | Source: Ambulatory Visit | Attending: Radiation Oncology | Admitting: Radiation Oncology

## 2020-12-30 VITALS — BP 115/74 | HR 77 | Temp 96.0°F | Resp 16 | Wt 187.8 lb

## 2020-12-30 DIAGNOSIS — C029 Malignant neoplasm of tongue, unspecified: Secondary | ICD-10-CM

## 2020-12-30 DIAGNOSIS — C01 Malignant neoplasm of base of tongue: Secondary | ICD-10-CM | POA: Insufficient documentation

## 2020-12-30 DIAGNOSIS — Z923 Personal history of irradiation: Secondary | ICD-10-CM | POA: Diagnosis not present

## 2020-12-30 NOTE — Progress Notes (Signed)
Nutrition Follow-up:  Patient with stage IV tongue cancer followed by Dr Janese Banks.  S/p total glossectomy with bilateral neck dissection on 07/15/20 at [facility].  PEG placed at [facility] on 09/22/20.  Patient completed chemotherapy on 3/1 and last radiation on 3/4.    Met with patient following MD appointment.  Patient reports that he is feeling stronger after completion of treatment.  Reports that he is giving 6 cartons of gluceran 1.5 (2 cartons at 4:30am with 76m of prostat, 2 cartons and prostat at 11:30am and 2 cartons at 5:30pm).  Flushes tube with 332mof water then gives prostat and water mixture and 6031mf water then tube feeding then 240-300m72mter after feeding for first 2 feedings.  Last feeding of the day gives 60ml5mwater first then 240-300ml 21mr after.  Reports bowel movement every 3 days, no change.  Tolerating well.  Receives supplies from [facility].      Medications: reviewed  Labs: reviewed  Anthropometrics:   Weight today 187 lb in radiation.   189 lb on 3/1 (medical oncology) 190 lb 6 oz in radiation on 2/24 188 lb on 2/22 (med oncology) 196 lb 4 oz on 2/9 191 lb on 1/24   Estimated Energy Needs  Kcals: 2250-26924-9324in: 112-135 g Fluid: 2.2 L  NUTRITION DIAGNOSIS: Inadequate oral intake but relying on feeding tube   INTERVENTION:  Continue glucerna 1.5, 6 cartons per day.  Patient aware that if weight continues to drop would recommend increase to 7 cartons of glucerna 1.5 daily.   Continue prostat and free water flushes Tube feeding provides 2336 calories, 147 g protein and 2040-2220 ml Patient has adequate enteral supplies and formula Patient has contact information    MONITORING, EVALUATION, GOAL: weight trends, tube feeding   NEXT VISIT: Tuesday, April 12th after MD visit  Jatavia Keltner B. Ebbie Cherry,Zenia ResidesLDSiesta ShoresReSnohomishtered Dietitian [phone])

## 2020-12-30 NOTE — Progress Notes (Signed)
Radiation Oncology Follow up Note  Name: Ronnie Buckley   Date:   12/30/2020 MRN:  [id number] DOB: Feb 07, 1951    This 71 y.o. 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  His oral mucositis is completely cleared.  COMPLICATIONS OF TREATMENT: none  FOLLOW UP COMPLIANCE: keeps appointments   PHYSICAL EXAM:  BP 115/74 (BP Location: Left Arm, Patient Position: Sitting)   Pulse 77   Temp (!) 96 F (35.6 C) (Tympanic)   Resp 16   Wt 187 lb 12.8 oz (85.2 kg)   BMI 26.95 kg/m  Oral cavity is clear.  Neck is clear without evidence of cervical adenopathy.  Well-developed well-nourished patient in NAD. HEENT reveals PERLA, EOMI, discs not visualized.  Oral cavity is clear. No oral mucosal lesions are identified. Neck is clear without evidence of cervical or supraclavicular adenopathy. Lungs are clear to A&P. Cardiac examination is essentially unremarkable with regular rate and rhythm without murmur rub or thrill. Abdomen is benign with no organomegaly or masses noted. Motor sensory and DTR levels are equal and symmetric in the upper and lower extremities. Cranial nerves II through XII are grossly intact. Proprioception is intact. No peripheral adenopathy or edema is identified. No motor or sensory levels are noted. Crude visual fields are within normal range.  RADIOLOGY RESULTS: PET CT scan has been ordered in June  PLAN: Present time patient is doing well recovered  well from his adjuvant chemoradiation and pleased with his overall progress.  He is a PET scan I believe in June and I have schedule a follow-up shortly thereafter.  He continues follow-up care with medical oncology.  Patient knows to call with any concerns.  I would like to take this opportunity to thank you for allowing me to participate in the care of your patient.Noreene Filbert, MD

## 2020-12-31 ENCOUNTER — Ambulatory Visit: Payer: Medicare HMO | Admitting: Speech Pathology

## 2021-01-11 ENCOUNTER — Inpatient Hospital Stay: Payer: Medicare HMO | Attending: Oncology

## 2021-01-11 ENCOUNTER — Encounter: Payer: Self-pay | Admitting: Oncology

## 2021-01-11 ENCOUNTER — Inpatient Hospital Stay (HOSPITAL_BASED_OUTPATIENT_CLINIC_OR_DEPARTMENT_OTHER): Payer: Medicare HMO | Admitting: Oncology

## 2021-01-11 ENCOUNTER — Inpatient Hospital Stay: Payer: Medicare HMO

## 2021-01-11 VITALS — BP 107/61 | HR 80 | Temp 98.0°F | Resp 20 | Wt 188.1 lb

## 2021-01-11 DIAGNOSIS — Z87891 Personal history of nicotine dependence: Secondary | ICD-10-CM | POA: Diagnosis not present

## 2021-01-11 DIAGNOSIS — D509 Iron deficiency anemia, unspecified: Secondary | ICD-10-CM | POA: Diagnosis not present

## 2021-01-11 DIAGNOSIS — Z95828 Presence of other vascular implants and grafts: Secondary | ICD-10-CM

## 2021-01-11 DIAGNOSIS — C778 Secondary and unspecified malignant neoplasm of lymph nodes of multiple regions: Secondary | ICD-10-CM | POA: Diagnosis not present

## 2021-01-11 DIAGNOSIS — C029 Malignant neoplasm of tongue, unspecified: Secondary | ICD-10-CM | POA: Insufficient documentation

## 2021-01-11 DIAGNOSIS — Z5111 Encounter for antineoplastic chemotherapy: Secondary | ICD-10-CM

## 2021-01-11 LAB — CBC WITH DIFFERENTIAL/PLATELET
Abs Immature Granulocytes: 0.05 10*3/uL (ref 0.00–0.07)
Basophils Absolute: 0.1 10*3/uL (ref 0.0–0.1)
Basophils Relative: 1 %
Eosinophils Absolute: 0.2 10*3/uL (ref 0.0–0.5)
Eosinophils Relative: 3 %
HCT: 35.3 % — ABNORMAL LOW (ref 39.0–52.0)
Hemoglobin: 11.9 g/dL — ABNORMAL LOW (ref 13.0–17.0)
Immature Granulocytes: 1 %
Lymphocytes Relative: 15 %
Lymphs Abs: 0.9 10*3/uL (ref 0.7–4.0)
MCH: 31.7 pg (ref 26.0–34.0)
MCHC: 33.7 g/dL (ref 30.0–36.0)
MCV: 94.1 fL (ref 80.0–100.0)
Monocytes Absolute: 0.5 10*3/uL (ref 0.1–1.0)
Monocytes Relative: 8 %
Neutro Abs: 4.2 10*3/uL (ref 1.7–7.7)
Neutrophils Relative %: 72 %
Platelets: 244 10*3/uL (ref 150–400)
RBC: 3.75 MIL/uL — ABNORMAL LOW (ref 4.22–5.81)
RDW: 19.9 % — ABNORMAL HIGH (ref 11.5–15.5)
WBC: 5.8 10*3/uL (ref 4.0–10.5)
nRBC: 0 % (ref 0.0–0.2)

## 2021-01-11 LAB — IRON AND TIBC
Iron: 69 ug/dL (ref 45–182)
Saturation Ratios: 21 % (ref 17.9–39.5)
TIBC: 332 ug/dL (ref 250–450)
UIBC: 263 ug/dL

## 2021-01-11 LAB — BASIC METABOLIC PANEL
Anion gap: 11 (ref 5–15)
BUN: 34 mg/dL — ABNORMAL HIGH (ref 8–23)
CO2: 27 mmol/L (ref 22–32)
Calcium: 9.3 mg/dL (ref 8.9–10.3)
Chloride: 100 mmol/L (ref 98–111)
Creatinine, Ser: 0.77 mg/dL (ref 0.61–1.24)
GFR, Estimated: >60 mL/min (ref >60)
Glucose, Bld: 101 mg/dL — ABNORMAL HIGH (ref 70–99)
Potassium: 4.5 mmol/L (ref 3.5–5.1)
Sodium: 138 mmol/L (ref 135–145)

## 2021-01-11 LAB — FOLATE: Folate: 38 ng/mL (ref >5.9)

## 2021-01-11 LAB — VITAMIN B12: Vitamin B-12: 526 pg/mL (ref 180–914)

## 2021-01-11 LAB — FERRITIN: Ferritin: 66 ng/mL (ref 24–336)

## 2021-01-11 MED ORDER — HEPARIN SOD (PORK) LOCK FLUSH 100 UNIT/ML IV SOLN
500.0000 [IU] | Freq: Once | INTRAVENOUS | Status: AC
Start: 2021-01-11 — End: 2021-01-11
  Administered 2021-01-11: 500 [IU] via INTRAVENOUS
  Filled 2021-01-11: qty 5

## 2021-01-11 MED ORDER — SODIUM CHLORIDE 0.9% FLUSH
10.0000 mL | Freq: Once | INTRAVENOUS | Status: AC
  Administered 2021-01-11: 10 mL via INTRAVENOUS
  Filled 2021-01-11: qty 10

## 2021-01-11 NOTE — Progress Notes (Signed)
Nutrition Follow-up:  Patient with stage IV tongue cancer followed by Dr Janese Banks.  S/p total glossectomy with bilateral neck dissection on 07/15/20 at [facility].  PEG placed with [facility] on 09/22/20.  Patient completed chemotherapy on 3/1 and last radiation on 3/4  Met with patient prior to MD visit.  Patient reports that he continues giving 6 cartons of glucerna 1.5 via feeding tube and 2 prostat daily.  (2 cartons at 4:30am with 8m of prostat, 2 cartons and prostat at 11:30am and 2 cartons at 5:30pm.  Flushes tube with 390mof water then gives prostat and water mixture and 6043mf water then tube feeding then 240-330m59m water after feeding for first 2 feedings.  Last feeding of the day gives 60ml46mwater first then 240-300ml 39mr after.  No change in bowel movements.  Has been out working in yard.  No tolerance issues to tube feeding.  Receives supplies from [facility] Medications: reviewed  Labs: reviewed  Anthropometrics:   Weight 188 lb 1.6 oz today  187 lb in radiation on 3/31 189 lb on 3/1 (medical oncology) 190 lb 6 oz in radiation on 2/24 188 lb on 2/22 (med oncology) 196 lb 4 oz on 2/9 191 lb on 1/24   Estimated Energy Needs  Kcals: 2250-2700 Protein: 112-135 g Fluid: 2.2 L  NUTRITION DIAGNOSIS: Inadequate oral intake but relying on feeding tube   INTERVENTION:  Continue glucerna 1.5, 6 cartons per day and prostat 30ml B44m  Tube feeding providing 2336 calories, 147 g protein and 2040-2220ml wa58mPatient has adequate enteral supplies Patient has contact information    MONITORING, EVALUATION, GOAL: weight trends, tube feeding   NEXT VISIT: Thursday, June 16 after MD visit  Aina Rossbach B. Dallan Schonberg, RZenia ResidesN TempletongiMcMillinred Dietitian [phone])

## 2021-01-12 ENCOUNTER — Other Ambulatory Visit: Payer: Self-pay

## 2021-01-12 ENCOUNTER — Ambulatory Visit: Payer: Medicare HMO | Admitting: Speech Pathology

## 2021-01-12 ENCOUNTER — Ambulatory Visit
Admission: RE | Admit: 2021-01-12 | Discharge: 2021-01-12 | Disposition: A | Discharge status: Home / Self Care | Payer: Medicare HMO | Source: Ambulatory Visit | Attending: Radiation Oncology | Admitting: Radiation Oncology

## 2021-01-12 DIAGNOSIS — C029 Malignant neoplasm of tongue, unspecified: Secondary | ICD-10-CM | POA: Diagnosis present

## 2021-01-12 DIAGNOSIS — Z9049 Acquired absence of other specified parts of digestive tract: Secondary | ICD-10-CM | POA: Insufficient documentation

## 2021-01-12 DIAGNOSIS — R1312 Dysphagia, oropharyngeal phase: Secondary | ICD-10-CM | POA: Diagnosis not present

## 2021-01-12 NOTE — Progress Notes (Signed)
Modified Barium Swallow Progress Note  Patient Details  Name: Ronnie Buckley MRN: [id number] Date of Birth: 1951/05/24  Today's Date: 01/12/2021  Modified Barium Swallow completed.  Full report located under Chart Review in the Imaging Section.  Brief recommendations include the following:  Clinical Impression  Pt is s/p ALT free flap reconstruction of total glossectomy. Per pt's plastic surgeon, flap is secured to bottom of pt's mouth in effort to provide bulk in his oral cavity. Therefore PO boluses are reliant on passive movement into pharynx. Pt presented trials of thin liquids via spoon to posterior right buccal cavity. Liquid transitioned into pharynx where it resided within pt's pyriform sinuses. Pt able to relax UES and consume bolus within pyriform sinuses without any airway compromise despite no epiglottic movement. Multiple boluses (>10) were observed with continued airway protection throughout as pt was able to contain all boluses within his pyriform sinuses. Small bolus of puree was attempted under fluro. As expected, bolus didn't transition to pharynx d/t thick texture. Pt's goal was to safely consume sips of thin liquids when at restaurants with his family. At this time, pt is safe to do so. He understand that his PEG is the source of his primary nutrition. Education completed on aspiration precautions as well as items that would be safe to consume.   Swallow Evaluation Recommendations       SLP Diet Recommendations: Thin liquid   Liquid Administration via: Spoon   Medication Administration: Via alternative means               Oral Care Recommendations: Oral care QID;Oral care before and after PO     Ronnie Buckley B. Rutherford Nail M.S., CCC-SLP, [place] Office [phone]    Ronnie Buckley Rutherford Nail 01/12/2021,10:15 AM

## 2021-01-12 NOTE — Progress Notes (Signed)
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 Conley Canal for a lesion that he identified on his left tongue. Biopsy on 06/15/2020 was consistent with squamous cell carcinoma. This was followed by a CT neck and chest. CT neck showed a large poorly defined infiltrative mass throughout the posterior left oral tongue and left oral base extending to the left glossotonsillar sulcus. The mass crosses midline through the lingual septum into the right hemitongue. Findings are concerning for left lateral retropharyngeal as well as level 1 and 4 nodal metastases. CT chest without contrast did not show any evidence of metastatic disease. Patient then underwent total glossectomy with bilateral neck dissection on 07/15/2020.  Pathology showed:Surgical Pathology Final Pathologic Diagnosis  A. LYMPH NODE, LEFT LEVEL 1B, DISSECTION: Benign salivary gland tissue. One out of six lymph nodes, positive for metastatic carcinoma (1/6).  B. LYMPH NODES, LEFT LEVEL 2A, 3, &4, DISSECTION: Sixteen lymph nodes, negative for metastatic carcinoma (0/16).  C. LYMPH NODES, LEFT LEVEL 2B, DISSECTION: Seven lymph nodes, negative for metastatic carcinoma (0/7).  D. LYMPH NODES, LEFT LEVEL 1A, DISSECTION: One lymph node, negative for metastatic carcinoma (0/1).  E. LYMPH NODES, RIGHT LEVEL 2A, 3, &4, DISSECTION: Eleven  lymph nodes, negative for metastatic carcinoma (0/11).  F. LYMPH NODES, RIGHT LEVEL 2A AND 2B, DISSECTION: Eleven lymph nodes, negative for metastatic carcinoma(0/11).  G. LINGUAL NERVE, RIGHT, EXCISION: Benign nerve tissue. No malignancy identified.  H. LYMPH NODES, RIGHT LEVEL 1B, DISSECTION: Benign salivary gland tissue. Three lymph nodes, negative for metastatic carcinoma. (0/3).  I. LINGUAL NERVE, PROXIMAL LEFT MARGIN, EXCISION: Benign peripheral nerve. No malignancy identified.  J. FLOOR OF MOUTH, LEFT MARGIN, EXCISION: Benign oropharyngeal mucosa. No malignancy identified.  K. SOFT PALATE, LEFT MARGIN, EXCISION: Benign oropharyngeal mucosa. No malignancy identified.  L. PHARYNGEAL MUCOSAL MARGIN, LEFT, EXCISION: Benign oropharyngeal mucosa. No malignancy identified.  M. VALLECULA MUCOSA MARGIN, EXCISION: Benign oropharyngeal mucosa. No malignancy identified.  N. PTERYGOID, DEEP TONSILLAR FOSSA MARGIN, EXCISION: Benign soft tissue. No malignancy identified.  O. TONGUE, TOTAL GLOSSECTOMY: Invasive moderately differentiated squamous cell carcinoma, keratinizing (4.6 cm). Tumor focally present at the deep and left tissue edges, please see separated margin status.  Lymphovascular invasion identified.  Perineural invasion is identified. Pathologic stage: pT4a pN1.  See CAP synoptic report.   Patient was evaluated by radiation oncology at [facility] and plan was to offer adjuvant concurrent chemoradiation. However patient lives in [place] and prefers to get care locally.He also underwent tracheostomy removal, port placement and PEG tube placement at [facility].  Patient completed concurrent chemoradiation with weekly cisplatin in March 2022.    Interval history-patient reports doing well.  Denies any significant throat pain.  He is still using his PEG tube entirely for his feeds and has a swallow evaluation coming up soon.  Weight has remained  stable  ECOG PS- 1 Pain scale- 0   Review of systems- Review of Systems  Constitutional: Positive for malaise/fatigue. Negative for chills, fever and  weight loss.  HENT: Negative for congestion, ear discharge and nosebleeds.   Eyes: Negative for blurred vision.  Respiratory: Negative for cough, hemoptysis, sputum production, shortness of breath and wheezing.   Cardiovascular: Negative for chest pain, palpitations, orthopnea and claudication.  Gastrointestinal: Negative for abdominal pain, blood in stool, constipation, diarrhea, heartburn, melena, nausea and vomiting.  Genitourinary: Negative for dysuria, flank pain, frequency, hematuria and urgency.  Musculoskeletal: Negative for back pain, joint pain and myalgias.  Skin: Negative for rash.  Neurological: Negative for dizziness, tingling, focal weakness, seizures, weakness and headaches.  Endo/Heme/Allergies: Does not bruise/bleed easily.  Psychiatric/Behavioral: Negative for depression and suicidal ideas. The patient does not have insomnia.       Allergies  Allergen Reactions  . Tape     Redness and bleeding with some tapes and adhesives.     Past Medical History:  Diagnosis Date  . Actinic keratosis 03/03/2010   left dorsum hand, bx proven     No past surgical history on file.  Social History   Socioeconomic History  . Marital status: Widowed    Spouse name: Not on file  . Number of children: Not on file  . Years of education: Not on file  . Highest education level: Not on file  Occupational History  . Not on file  Tobacco Use  . Smoking status: Former Smoker    Types: Cigarettes  . Smokeless tobacco: Never Used  . Tobacco comment: 60 years ago  Vaping Use  . Vaping Use: Never used  Substance and Sexual Activity  . Alcohol use: Not Currently  . Drug use: Never  . Sexual activity: Not Currently  Other Topics Concern  . Not on file  Social History Narrative  . Not on file   Social Determinants of Health    Financial Resource Strain: Not on file  Food Insecurity: Not on file  Transportation Needs: Not on file  Physical Activity: Not on file  Stress: Not on file  Social Connections: Not on file  Intimate Partner Violence: Not on file    No family history on file.   Current Outpatient Medications:  .  acetaminophen (TYLENOL) 500 MG tablet, Take 2 tablets by mouth every 6 (six) hours as needed., Disp: , Rfl:  .  Ascorbic Acid (VITAMIN C) 1000 MG tablet, Take 1,000 mg by mouth daily., Disp: , Rfl:  .  aspirin EC 81 MG tablet, Take 81 mg by mouth daily. Swallow whole., Disp: , Rfl:  .  DENTAGEL 1.1 % GEL dental gel, PLEASE SEE ATTACHED FOR DETAILED DIRECTIONS, Disp: , Rfl:  .  enalapril (VASOTEC) 10 MG tablet, Take 10 mg by mouth daily., Disp: , Rfl:  .  enalapril (VASOTEC) 20 MG tablet, Take 20 mg by mouth daily., Disp: , Rfl:  .  glipiZIDE (GLUCOTROL) 10 MG tablet, Take 10 mg by mouth daily before breakfast., Disp: , Rfl:  .  hydrochlorothiazide (MICROZIDE) 12.5 MG capsule, Take 12.5 mg by mouth daily., Disp: , Rfl:  .  lidocaine-prilocaine (EMLA) cream, Apply 1 application topically as directed. aplly small amount of cream over the port 1 1/2 hours before each treatment, place saran wrap over cream to protect the clothing, Disp: 30 g, Rfl: 2 .  LORazepam (ATIVAN) 0.5 MG tablet, Take 1 tablet (0.5 mg total) by mouth every 8 (eight) hours as needed for anxiety., Disp: 30 tablet, Rfl: 0 .  magic mouthwash w/lidocaine SOLN, Take 5 mLs by mouth 4 (four) times daily as needed for mouth  pain., Disp: 480 mL, Rfl: 1 .  metFORMIN (GLUCOPHAGE) 500 MG tablet, Take 1,000 mg by mouth 2 (two) times daily., Disp: , Rfl:  .  Multiple Vitamin (MULTI-VITAMIN) tablet, Take 1 tablet by mouth daily., Disp: , Rfl:  .  ondansetron (ZOFRAN) 8 MG tablet, Take 1 tablet (8 mg total) by mouth every 8 (eight) hours as needed for nausea or vomiting. Start on the 3rd day after chemotherapy, Disp: 20 tablet, Rfl: 0 .   pravastatin (PRAVACHOL) 40 MG tablet, Take 1 tablet by mouth daily., Disp: , Rfl:  .  prednisoLONE (ORAPRED) 15 MG/5ML solution, Take by mouth., Disp: , Rfl:  .  prochlorperazine (COMPAZINE) 10 MG tablet, Take 1 tablet (10 mg total) by mouth every 6 (six) hours as needed for nausea or vomiting., Disp: 30 tablet, Rfl: 0 .  sodium fluoride (FLUORISHIELD) 1.1 % GEL dental gel, Brush and floss your teeth. Place 1 drop of gel in each tooth space of fluoride gel trays and insert trays. Wear trays each day for 10 mins at bedtime, then remove trays and spit out excess gel. Do not eat, drink, or rinse your mouth for 30 minutes after application., Disp: , Rfl:  .  aspirin 325 MG tablet, Take 1 tablet by mouth in the morning and at bedtime. (Patient not taking: No sig reported), Disp: , Rfl:  .  dexamethasone (DECADRON) 4 MG tablet, Take 1 tablet (4 mg total) by mouth 2 (two) times daily with a meal. Take 2 tablets Daily for 3 days, starting the day After cisplatin chemotherapy (Patient not taking: No sig reported), Disp: 30 tablet, Rfl: 1 .  sucralfate (CARAFATE) 1 g tablet, Take 0.5 tablets (0.5 g total) by mouth 3 (three) times daily. Dissolve in 3-4 tbsp warm water, swish and swallow. (Patient not taking: No sig reported), Disp: 60 tablet, Rfl: 1  Physical exam:  Vitals:   01/11/21 1439  BP: 107/61  Pulse: 80  Resp: 20  Temp: 98 F (36.7 C)  TempSrc: Tympanic  SpO2: 96%  Weight: 188 lb 1.6 oz (85.3 kg)   Physical Exam Constitutional:      General: He is not in acute distress. HENT:     Mouth/Throat:     Comments: Changes of glossectomy and reconstruction.  No evidence of mucositis Cardiovascular:     Rate and Rhythm: Normal rate and regular rhythm.     Heart sounds: Normal heart sounds.  Pulmonary:     Effort: Pulmonary effort is normal.     Breath sounds: Normal breath sounds.  Abdominal:     General: Bowel sounds are normal.     Palpations: Abdomen is soft.  Skin:    General: Skin is  warm and dry.  Neurological:     Mental Status: He is alert and oriented to person, place, and time.      CMP Latest Ref Rng & Units 01/11/2021  Glucose 70 - 99 mg/dL 101(H)  BUN 8 - 23 mg/dL 34(H)  Creatinine 0.61 - 1.24 mg/dL 0.77  Sodium 135 - 145 mmol/L 138  Potassium 3.5 - 5.1 mmol/L 4.5  Chloride 98 - 111 mmol/L 100  CO2 22 - 32 mmol/L 27  Calcium 8.9 - 10.3 mg/dL 9.3   CBC Latest Ref Rng & Units 01/11/2021  WBC 4.0 - 10.5 K/uL 5.8  Hemoglobin 13.0 - 17.0 g/dL 11.9(L)  Hematocrit 39.0 - 52.0 % 35.3(L)  Platelets 150 - 400 K/uL 244    No images are attached to the encounter.  Leanne Chang OP MEDICARE SPEECH PATH  Result Date: 01/12/2021 Objective Swallowing Evaluation: Type of Study: MBS-Modified Barium Swallow Study  Patient Details Name: UMAR PATMON MRN: [id number] Date of Birth: [birth year] Today's Date: 01/12/2021 Time: SLP Start Time (ACUTE ONLY): 0900 -SLP Stop Time (ACUTE ONLY): 0920 SLP Time Calculation (min) (ACUTE ONLY): 20 min Past Medical History: Past Medical History: Diagnosis Date . Actinic keratosis 03/03/2010  left dorsum hand, bx proven Past Surgical History: No past surgical history on file. HPI: Pt is s/p an ALT free flap reconstruction of total glossectomy on 07/30/20 d/t squamous cell carcinoma of the oral tongue. In addition to pt's total glossectomy, he also had left modified radical neck dissection, right modified radical neck dissection and left partial pharyngectomy. Recent Flexible Endoscopic Evaluation of Swallowing (FEES) was conducted on 10/13/2020. At that time, pt demonstrated a severe oropharyngeal dysphagia and it was recommended that patient continue nutrition/hydration/medication through the G-tube, with teaspoon sips of water and/or ice chips by mouth following meticulous oral care. Pt is > 4 [facility].  Subjective: pt pleasant, improved speech intelligibility, pt known to this therapist Assessment / Plan /  Recommendation CHL IP CLINICAL IMPRESSIONS 01/12/2021 Clinical Impression Pt is s/p ALT free flap reconstruction of total glossectomy. Per pt's plastic surgeon, flap is secured to bottom of pt's mouth in effort to provide bulk in his oral cavity. Therefore PO boluses are reliant on passive movement into pharynx. Pt presented trials of thin liquids via spoon to posterior right buccal cavity. Liquid transitioned into pharynx where it resided within pt's pyriform sinuses. Pt able to relax UES and consume bolus within pyrifrom sinuses without any airway compromise depsite no epiglottic movement. Multiple boluses (>10) were observed with continued airway protection throughout as pt was able to contain all boluses within his pyriform sinuses. Small bolus of puree was attempted under fluro. As expected, bolus didn't transition to pharynx d/t thick texture. Pt's goal was to safely consume sips of thin liquids when at restuarants with his family. At this time, pt is safe to do so. He understand that his PEG is the source of his primary nutrition. Education completed on aspiration precautions as well as items that would be safe to consume. SLP Visit Diagnosis Dysphagia, oropharyngeal phase (R13.12) Attention and concentration deficit following -- Frontal lobe and executive function deficit following -- Impact on safety and function Moderate aspiration risk   CHL IP TREATMENT RECOMMENDATION 01/12/2021 Treatment Recommendations No treatment recommended at this time   Prognosis 01/12/2021 Prognosis for Safe Diet Advancement (No Data) Barriers to Reach Goals -- Barriers/Prognosis Comment -- CHL IP DIET RECOMMENDATION 01/12/2021 SLP Diet Recommendations Thin liquid Liquid Administration via Spoon Medication Administration Via alternative means Compensations -- Postural Changes --   CHL IP OTHER RECOMMENDATIONS 01/12/2021 Recommended Consults -- Oral Care Recommendations Oral care QID;Oral care before and after PO Other Recommendations  --   CHL IP FOLLOW UP RECOMMENDATIONS 01/12/2021 Follow up Recommendations None   No flowsheet data found.     CHL IP ORAL PHASE 01/12/2021 Oral Phase Impaired Oral - Pudding Teaspoon -- Oral - Pudding Cup -- Oral - Honey Teaspoon -- Oral - Honey Cup -- Oral - Nectar Teaspoon -- Oral - Nectar Cup -- Oral - Nectar Straw -- Oral - Thin Teaspoon -- Oral - Thin Cup -- Oral - Thin Straw -- Oral - Puree -- Oral - Mech Soft -- Oral - Regular -- Oral - Multi-Consistency -- Oral - Pill --  Oral Phase - Comment --  CHL IP PHARYNGEAL PHASE 01/12/2021 Pharyngeal Phase Impaired Pharyngeal- Pudding Teaspoon -- Pharyngeal -- Pharyngeal- Pudding Cup -- Pharyngeal -- Pharyngeal- Honey Teaspoon -- Pharyngeal -- Pharyngeal- Honey Cup -- Pharyngeal -- Pharyngeal- Nectar Teaspoon -- Pharyngeal -- Pharyngeal- Nectar Cup -- Pharyngeal -- Pharyngeal- Nectar Straw -- Pharyngeal -- Pharyngeal- Thin Teaspoon -- Pharyngeal -- Pharyngeal- Thin Cup -- Pharyngeal -- Pharyngeal- Thin Straw -- Pharyngeal -- Pharyngeal- Puree -- Pharyngeal -- Pharyngeal- Mechanical Soft -- Pharyngeal -- Pharyngeal- Regular -- Pharyngeal -- Pharyngeal- Multi-consistency -- Pharyngeal -- Pharyngeal- Pill -- Pharyngeal -- Pharyngeal Comment --  CHL IP CERVICAL ESOPHAGEAL PHASE 01/12/2021 Cervical Esophageal Phase WFL Pudding Teaspoon -- Pudding Cup -- Honey Teaspoon -- Honey Cup -- Nectar Teaspoon -- Nectar Cup -- Nectar Straw -- Thin Teaspoon -- Thin Cup -- Thin Straw -- Puree -- Mechanical Soft -- Regular -- Multi-consistency -- Pill -- Cervical Esophageal Comment -- Happi B. Rutherford Nail M.S., CCC-SLP, [place] Office [phone] Stormy Fabian 01/12/2021, 10:17 AM            CLINICAL DATA:  Dysphagia. Status post glossectomy EXAM: MODIFIED BARIUM SWALLOW TECHNIQUE: Different consistencies of barium were administered orally to the patient by the Speech Pathologist. Imaging of the pharynx was performed in the lateral projection.  The radiologist was present in the fluoroscopy room for this study, providing personal supervision. FLUOROSCOPY TIME:  Fluoroscopy Time:  1 minutes 6 seconds Radiation Exposure Index (if provided by the fluoroscopic device): 4.7 mGy Number of Acquired Spot Images: Multiple cine fluoroscopic runs COMPARISON:  None. FINDINGS: Thin and puree forms of barium were administered. Note is made of findings of total glossectomy. No significant epiglottic motion is noted although no significant laryngeal penetration or tracheal aspiration is seen with the thin barium. The puree form of barium was difficult to clear from the mouth despite administering water. IMPRESSION: Status post total glossectomy. No epiglottic motion is seen although no significant penetration or tracheal aspiration is noted with thin barium. Please refer to the Speech Pathologists report for complete details and recommendations. Electronically Signed   By: Inez Catalina M.D.   On: 01/12/2021 09:44     Assessment and plan- Patient is a 70 y.o. 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  He is s/p concurrent chemoradiation with weekly cisplatin and this is a routine follow-up visit  Patient is now 1 month since completing concurrent chemoradiation which he tolerated well without any significant side effect.  White cell count has normalized.  Patient did have evidence of iron deficiency anemia and received Venofer during his chemotherapy.  Hemoglobin has improved to 11.9 from a prior value of 10.6.  Iron studies are presently within normal limits.  Patient will continue to use PEG tube for his nutritional needs until cleared by speech pathology to start oral feeds.  He is scheduled for PET CT scan in June 2022 when I will see him following that.   Visit Diagnosis 1. Tongue cancer ([place])   2. Iron deficiency anemia, unspecified iron deficiency anemia type       Dr. Randa Evens, MD, MPH [facility] [phone] 01/12/2021 11:31 AM

## 2021-01-17 ENCOUNTER — Ambulatory Visit: Payer: Medicare HMO | Admitting: Dermatology

## 2021-01-19 ENCOUNTER — Encounter: Payer: Medicare HMO | Admitting: Speech Pathology

## 2021-01-26 ENCOUNTER — Encounter: Payer: Medicare HMO | Admitting: Speech Pathology

## 2021-03-01 ENCOUNTER — Telehealth: Payer: Self-pay | Admitting: Oncology

## 2021-03-01 NOTE — Telephone Encounter (Signed)
Spoke with patient to notify him of PET scan scheduled on 6/7. Reviewed day/time and instructions with patient. He is agreeable.

## 2021-03-02 ENCOUNTER — Ambulatory Visit: Payer: Medicare HMO

## 2021-03-03 ENCOUNTER — Other Ambulatory Visit: Payer: Self-pay

## 2021-03-03 ENCOUNTER — Ambulatory Visit (INDEPENDENT_AMBULATORY_CARE_PROVIDER_SITE_OTHER): Payer: Medicare HMO | Admitting: Dermatology

## 2021-03-03 DIAGNOSIS — L570 Actinic keratosis: Secondary | ICD-10-CM | POA: Diagnosis not present

## 2021-03-03 DIAGNOSIS — L578 Other skin changes due to chronic exposure to nonionizing radiation: Secondary | ICD-10-CM

## 2021-03-03 DIAGNOSIS — D692 Other nonthrombocytopenic purpura: Secondary | ICD-10-CM | POA: Diagnosis not present

## 2021-03-03 NOTE — Progress Notes (Signed)
Follow-Up Visit   Subjective  Ronnie Buckley is a 70 y.o. male who presents for the following: Actinic Keratosis (Face and scalp x 9 - check for new or persisting lesions).  The following portions of the chart were reviewed this encounter and updated as appropriate:   Tobacco  Allergies  Meds  Problems  Med Hx  Surg Hx  Fam Hx     Review of Systems:  No other skin or systemic complaints except as noted in HPI or Assessment and Plan.  Objective  Well appearing patient in no apparent distress; mood and affect are within normal limits.  A focused examination was performed including the face and scalp. Relevant physical exam findings are noted in the Assessment and Plan.  Objective  Face and scalp x 28 (28): Erythematous thin papules/macules with gritty scale.    Assessment & Plan  AK (actinic keratosis) (28) Face and scalp x 28  Destruction of lesion - Face and scalp x 28 Complexity: simple   Destruction method: cryotherapy   Informed consent: discussed and consent obtained   Timeout:  patient name, date of birth, surgical site, and procedure verified Lesion destroyed using liquid nitrogen: Yes   Region frozen until ice ball extended beyond lesion: Yes   Outcome: patient tolerated procedure well with no complications   Post-procedure details: wound care instructions given    Actinic Damage - Severe, confluent actinic changes with pre-cancerous actinic keratoses  - Severe, chronic, not at goal, secondary to cumulative UV radiation exposure over time - diffuse scaly erythematous macules and papules with underlying dyspigmentation - Discussed Prescription "Field Treatment" for Severe, Chronic Confluent Actinic Changes with Pre-Cancerous Actinic Keratoses Field treatment involves treatment of an entire area of skin that has confluent Actinic Changes (Sun/ Ultraviolet light damage) and PreCancerous Actinic Keratoses by method of PhotoDynamic Therapy (PDT) and/or  prescription Topical Chemotherapy agents such as 5-fluorouracil, 5-fluorouracil/calcipotriene, and/or imiquimod.  The purpose is to decrease the number of clinically evident and subclinical PreCancerous lesions to prevent progression to development of skin cancer by chemically destroying early precancer changes that may or may not be visible.  It has been shown to reduce the risk of developing skin cancer in the treated area. As a result of treatment, redness, scaling, crusting, and open sores may occur during treatment course. One or more than one of these methods may be used and may have to be used several times to control, suppress and eliminate the PreCancerous changes. Discussed treatment course, expected reaction, and possible side effects. - Recommend daily broad spectrum sunscreen SPF 30+ to sun-exposed areas, reapply every 2 hours as needed.  - Staying in the shade or wearing long sleeves, sun glasses (UVA+UVB protection) and wide brim hats (4-inch brim around the entire circumference of the hat) are also recommended. - Call for new or changing lesions. - In 4 weeks Skin Medicinals mix BID x 1 wk to the scalp and forehead.   Purpura - Chronic; persistent and recurrent.  Treatable, but not curable. - Violaceous macules and patches - Benign - Related to trauma, age, sun damage and/or use of blood thinners, chronic use of topical and/or oral steroids - Observe - Can use OTC arnica containing moisturizer such as Dermend Bruise Formula if desired - Call for worsening or other concerns  Return in about 3 months (around 06/03/2021) for AK follow up .  Luther Redo, CMA, am acting as scribe for Sarina Ser, MD .  Documentation: I have reviewed the above  documentation for accuracy and completeness, and I agree with the above.  Sarina Ser, MD

## 2021-03-03 NOTE — Patient Instructions (Addendum)
If you have any questions or concerns for your doctor, please call our main line at [phone] and press option 4 to reach your doctor's medical assistant. If no one answers, please leave a voicemail as directed and we will return your call as soon as possible. Messages left after 4 pm will be answered the following business day.   You may also send us a message via MyChart. We typically respond to MyChart messages within 1-2 business days.  For prescription refills, please ask your pharmacy to contact our office. Our fax number is [phone].  If you have an urgent issue when the clinic is closed that cannot wait until the next business day, you can page your doctor at the number below.    Please note that while we do our best to be available for urgent issues outside of office hours, we are not available 24/7.   If you have an urgent issue and are unable to reach us, you may choose to seek medical care at your doctor's office, retail clinic, urgent care center, or emergency room.  If you have a medical emergency, please immediately call 911 or go to the emergency department.  Pager Numbers  - Dr. Kowalski: [phone]  - Dr. Moye: [phone]  - Dr. Stewart: [phone]  In the event of inclement weather, please call our main line at [phone] for an update on the status of any delays or closures.  Dermatology Medication Tips: Please keep the boxes that topical medications come in in order to help keep track of the instructions about where and how to use these. Pharmacies typically print the medication instructions only on the boxes and not directly on the medication tubes.   If your medication is too expensive, please contact our office at [phone] option 4 or send us a message through MyChart.   We are unable to tell what your co-pay for medications will be in advance as this is different depending on your insurance coverage. However, we may be able to find a substitute  medication at lower cost or fill out paperwork to get insurance to cover a needed medication.   If a prior authorization is required to get your medication covered by your insurance company, please allow us 1-2 business days to complete this process.  Drug prices often vary depending on where the prescription is filled and some pharmacies may offer cheaper prices.  The website www.goodrx.com contains coupons for medications through different pharmacies. The prices here do not account for what the cost may be with help from insurance (it may be cheaper with your insurance), but the website can give you the price if you did not use any insurance.  - You can print the associated coupon and take it with your prescription to the pharmacy.  - You may also stop by our office during regular business hours and pick up a GoodRx coupon card.  - If you need your prescription sent electronically to a different pharmacy, notify our office through [facility] MyChart or by phone at [phone] option 4.  Instructions for [facility] Medications  One or more of your medications was sent to the [facility] mail order compounding pharmacy. You will receive an email from them and can purchase the medicine through that link. It will then be mailed to your home at the address you confirmed. If for any reason you do not receive an email from them, please check your spam folder. If you still do not find the email,   please let us know. [facility] phone number is [phone].   

## 2021-03-04 ENCOUNTER — Encounter: Payer: Self-pay | Admitting: Dermatology

## 2021-03-08 ENCOUNTER — Other Ambulatory Visit: Payer: Self-pay

## 2021-03-08 ENCOUNTER — Ambulatory Visit
Admission: RE | Admit: 2021-03-08 | Discharge: 2021-03-08 | Disposition: A | Discharge status: Home / Self Care | Payer: Medicare HMO | Source: Ambulatory Visit | Attending: Oncology | Admitting: Oncology

## 2021-03-08 DIAGNOSIS — I700 Atherosclerosis of aorta: Secondary | ICD-10-CM | POA: Insufficient documentation

## 2021-03-08 DIAGNOSIS — C029 Malignant neoplasm of tongue, unspecified: Secondary | ICD-10-CM | POA: Insufficient documentation

## 2021-03-08 DIAGNOSIS — Z5111 Encounter for antineoplastic chemotherapy: Secondary | ICD-10-CM

## 2021-03-08 LAB — GLUCOSE, CAPILLARY: Glucose-Capillary: 132 mg/dL — ABNORMAL HIGH (ref 70–99)

## 2021-03-08 MED ORDER — FLUDEOXYGLUCOSE F - 18 (FDG) INJECTION
9.7000 | Freq: Once | INTRAVENOUS | Status: AC | PRN
  Administered 2021-03-08: 9.81 via INTRAVENOUS

## 2021-03-10 ENCOUNTER — Ambulatory Visit: Payer: Medicare HMO | Admitting: Radiation Oncology

## 2021-03-10 ENCOUNTER — Ambulatory Visit
Admission: RE | Admit: 2021-03-10 | Discharge: 2021-03-10 | Disposition: A | Discharge status: Home / Self Care | Payer: Medicare HMO | Source: Ambulatory Visit | Attending: Radiation Oncology | Admitting: Radiation Oncology

## 2021-03-10 ENCOUNTER — Other Ambulatory Visit: Payer: Self-pay

## 2021-03-10 ENCOUNTER — Encounter: Payer: Self-pay | Admitting: Radiation Oncology

## 2021-03-10 VITALS — BP 110/66 | HR 67 | Temp 97.0°F | Wt 181.6 lb

## 2021-03-10 DIAGNOSIS — C01 Malignant neoplasm of base of tongue: Secondary | ICD-10-CM | POA: Diagnosis not present

## 2021-03-10 DIAGNOSIS — Z923 Personal history of irradiation: Secondary | ICD-10-CM | POA: Insufficient documentation

## 2021-03-10 DIAGNOSIS — C029 Malignant neoplasm of tongue, unspecified: Secondary | ICD-10-CM

## 2021-03-10 DIAGNOSIS — Z9221 Personal history of antineoplastic chemotherapy: Secondary | ICD-10-CM | POA: Insufficient documentation

## 2021-03-10 NOTE — Progress Notes (Signed)
Radiation Oncology Follow up Note  Name: Ronnie Buckley   Date:   03/10/2021 MRN:  [id number] DOB: April 29, 1951    This 70 y.o. 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  Seen today in routine follow-up he is doing well he specifically denies any head and neck pain.  He is having no dysphagia to liquids he receives most of his nutrition from his feeding tube.  He had a PET CT scan which I have reviewed.  Showing mild hypermetabolic 70 and fullness in the right piriform sinus possibly related to retained secretions.  On my review there seems to be slight bilateral hypermetabolic activity which is symmetrical this is not thought to represent in my opinion residual malignancy.  COMPLICATIONS OF TREATMENT: none  FOLLOW UP COMPLIANCE: keeps appointments   PHYSICAL EXAM:  BP 110/66   Pulse 67   Temp (!) 97 F (36.1 C) (Tympanic)   Wt 181 lb 9.6 oz (82.4 kg)   BMI 26.06 kg/m  Oral cavity is clear no oral mucosal lesions are identified.  Neck is clear showing changes consistent with radiation and surgery.  Well-developed well-nourished patient in NAD. HEENT reveals PERLA, EOMI, discs not visualized.  Oral cavity is clear. No oral mucosal lesions are identified. Neck is clear without evidence of cervical or supraclavicular adenopathy. Lungs are clear to A&P. Cardiac examination is essentially unremarkable with regular rate and rhythm without murmur rub or thrill. Abdomen is benign with no organomegaly or masses noted. Motor sensory and DTR levels are equal and symmetric in the upper and  lower extremities. Cranial nerves II through XII are grossly intact. Proprioception is intact. No peripheral adenopathy or edema is identified. No motor or sensory levels are noted. Crude visual fields are within normal range.  RADIOLOGY RESULTS: PET CT scan reviewed compatible with above-stated findings  PLAN: Present time patient is doing well he has a follow-up appointment I believe next week with ENT for examination.  He continues follow-up care with medical oncology.  I am pleased with his overall response and PET/CT findings.  Of asked to see him back in 6 months for follow-up.  Patient knows to call with any concerns.  I would like to take this opportunity to thank you for allowing me to participate in the care of your patient.Noreene Filbert, MD

## 2021-03-17 ENCOUNTER — Ambulatory Visit: Payer: Medicare HMO | Admitting: Oncology

## 2021-03-17 ENCOUNTER — Other Ambulatory Visit: Payer: Medicare HMO

## 2021-03-29 ENCOUNTER — Inpatient Hospital Stay (HOSPITAL_BASED_OUTPATIENT_CLINIC_OR_DEPARTMENT_OTHER): Payer: Medicare HMO | Admitting: Oncology

## 2021-03-29 ENCOUNTER — Inpatient Hospital Stay: Payer: Medicare HMO

## 2021-03-29 ENCOUNTER — Inpatient Hospital Stay: Payer: Medicare HMO | Attending: Oncology

## 2021-03-29 VITALS — BP 102/60 | HR 73 | Temp 97.2°F | Resp 18

## 2021-03-29 DIAGNOSIS — C029 Malignant neoplasm of tongue, unspecified: Secondary | ICD-10-CM | POA: Insufficient documentation

## 2021-03-29 DIAGNOSIS — Z87891 Personal history of nicotine dependence: Secondary | ICD-10-CM | POA: Diagnosis not present

## 2021-03-29 DIAGNOSIS — N400 Enlarged prostate without lower urinary tract symptoms: Secondary | ICD-10-CM | POA: Diagnosis not present

## 2021-03-29 DIAGNOSIS — Z95828 Presence of other vascular implants and grafts: Secondary | ICD-10-CM

## 2021-03-29 LAB — CBC WITH DIFFERENTIAL/PLATELET
Abs Immature Granulocytes: 0.01 10*3/uL (ref 0.00–0.07)
Basophils Absolute: 0 10*3/uL (ref 0.0–0.1)
Basophils Relative: 1 %
Eosinophils Absolute: 0.1 10*3/uL (ref 0.0–0.5)
Eosinophils Relative: 2 %
HCT: 35.9 % — ABNORMAL LOW (ref 39.0–52.0)
Hemoglobin: 12.2 g/dL — ABNORMAL LOW (ref 13.0–17.0)
Immature Granulocytes: 0 %
Lymphocytes Relative: 25 %
Lymphs Abs: 0.8 10*3/uL (ref 0.7–4.0)
MCH: 33.6 pg (ref 26.0–34.0)
MCHC: 34 g/dL (ref 30.0–36.0)
MCV: 98.9 fL (ref 80.0–100.0)
Monocytes Absolute: 0.3 10*3/uL (ref 0.1–1.0)
Monocytes Relative: 8 %
Neutro Abs: 2.2 10*3/uL (ref 1.7–7.7)
Neutrophils Relative %: 64 %
Platelets: 173 10*3/uL (ref 150–400)
RBC: 3.63 MIL/uL — ABNORMAL LOW (ref 4.22–5.81)
RDW: 12.5 % (ref 11.5–15.5)
WBC: 3.4 10*3/uL — ABNORMAL LOW (ref 4.0–10.5)
nRBC: 0 % (ref 0.0–0.2)

## 2021-03-29 LAB — BASIC METABOLIC PANEL
Anion gap: 9 (ref 5–15)
BUN: 31 mg/dL — ABNORMAL HIGH (ref 8–23)
CO2: 26 mmol/L (ref 22–32)
Calcium: 8.9 mg/dL (ref 8.9–10.3)
Chloride: 100 mmol/L (ref 98–111)
Creatinine, Ser: 0.68 mg/dL (ref 0.61–1.24)
GFR, Estimated: >60 mL/min (ref >60)
Glucose, Bld: 117 mg/dL — ABNORMAL HIGH (ref 70–99)
Potassium: 4.3 mmol/L (ref 3.5–5.1)
Sodium: 135 mmol/L (ref 135–145)

## 2021-03-29 LAB — PSA: Prostatic Specific Antigen: 0.27 ng/mL (ref 0.00–4.00)

## 2021-03-29 LAB — MAGNESIUM: Magnesium: 2.3 mg/dL (ref 1.7–2.4)

## 2021-03-29 MED ORDER — HEPARIN SOD (PORK) LOCK FLUSH 100 UNIT/ML IV SOLN
500.0000 [IU] | Freq: Once | INTRAVENOUS | Status: AC
Start: 2021-03-29 — End: 2021-03-29
  Administered 2021-03-29: 500 [IU] via INTRAVENOUS
  Filled 2021-03-29: qty 5

## 2021-03-29 MED ORDER — SODIUM CHLORIDE 0.9% FLUSH
10.0000 mL | INTRAVENOUS | Status: DC | PRN
  Administered 2021-03-29: 10 mL via INTRAVENOUS
  Filled 2021-03-29: qty 10

## 2021-03-29 NOTE — Progress Notes (Addendum)
Nutrition Follow-up:   Patient with stage IV tongue cancer followed by Dr Janese Banks.  S/p total glossectomy with bilateral neck dissection on 07/15/20 at [facility].  PEG placed on 09/22/20 at [facility].  Patient completed chemotherapy on 3/1 and radiation on 3/4.  Met with patient after MD visit.  Patient reports that he continues to take 6 cartons of glucerna 1.5 daily (2 carton TID). Has not missed any doses of tube feeding.  Gives prostat 51m BID. Gives 30-636mof water, then prostat and water mixture and then 300-36063mater after.  MBSS completed and has been sipping on thin liquids (water with lemon, broth, broth from cooked pinto beans, tomato basil soup(thinned) per SLP recommendations.  Has not reduced tube feeding because of eating orally  Reports tolerating feeding well. Having bowel movement about every other day.    Has enteral supplies and gets them from [facility]  Patient has been working more outside. Says he feels good.   Medications: reviewed  Labs: reviewed  Anthropometrics:   Weight 182 lb today   Estimated Energy Needs  Kcals: 2250-2700 Protein: 112-135 g Fluid: 2.2 L  NUTRITION DIAGNOSIS: Inadequate oral intake but relying on feeding tube   INTERVENTION:  Would not recommend cutting back on tube feeding with approved oral intake.  Patient needs to increase calories to prevent further weight loss. Can add oral nutrition supplements for additional calories and protein or another carton of tube feeding.    Patient has tube feeding supplies Patient has RD contact information.    MONITORING, EVALUATION, GOAL: weight trends, intake, tube feeding   NEXT VISIT: Tuesday, Sept 27 after port flush Pt to call RD if needed sooner or if weight drops  Noriko Macari B. AllZenia ResidesD,[place]istered Dietitian [phone])

## 2021-04-02 ENCOUNTER — Encounter: Payer: Self-pay | Admitting: Oncology

## 2021-04-02 NOTE — Progress Notes (Signed)
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 Conley Canal for a lesion that he identified on his left tongue.  Biopsy on 06/15/2020 was consistent with squamous cell carcinoma.  This was followed by a CT neck and chest.  CT neck showed a large poorly defined infiltrative mass throughout the posterior left oral tongue and left oral base extending to the left glossotonsillar sulcus.  The mass crosses midline through the lingual septum into the right hemitongue.  Findings are concerning for left lateral retropharyngeal as well as level 1 and 4 nodal metastases.  CT chest without contrast did not show any evidence of metastatic disease.  Patient then underwent total glossectomy with bilateral neck dissection on 07/15/2020.   Pathology showed:Surgical Pathology Final Pathologic Diagnosis  A. LYMPH NODE, LEFT LEVEL 1B, DISSECTION: Benign salivary gland tissue. One out of six lymph nodes, positive for metastatic carcinoma (1/6).   B. LYMPH NODES, LEFT LEVEL 2A, 3, & 4, DISSECTION: Sixteen lymph nodes, negative for metastatic carcinoma (0/16).   C. LYMPH NODES, LEFT LEVEL 2B, DISSECTION: Seven lymph nodes, negative for metastatic carcinoma (0/7).   D. LYMPH NODES, LEFT LEVEL 1A, DISSECTION: One lymph node, negative for metastatic carcinoma (0/1).   E. LYMPH NODES, RIGHT LEVEL 2A, 3, &4, DISSECTION: Eleven  lymph nodes, negative for metastatic carcinoma (0/11).   F. LYMPH NODES, RIGHT LEVEL 2A AND 2B, DISSECTION: Eleven lymph nodes, negative for metastatic carcinoma(0/11).   G. LINGUAL NERVE, RIGHT, EXCISION: Benign nerve tissue. No malignancy identified.   H. LYMPH NODES, RIGHT LEVEL 1B, DISSECTION: Benign salivary gland tissue. Three lymph nodes, negative for metastatic carcinoma. (0/3).   I. LINGUAL NERVE, PROXIMAL LEFT MARGIN, EXCISION: Benign peripheral nerve. No malignancy identified.   J. FLOOR OF MOUTH, LEFT MARGIN, EXCISION: Benign oropharyngeal mucosa. No malignancy identified.   K. SOFT PALATE, LEFT MARGIN, EXCISION: Benign oropharyngeal mucosa. No malignancy identified.   L. PHARYNGEAL MUCOSAL MARGIN, LEFT, EXCISION: Benign oropharyngeal mucosa. No malignancy identified.   M. VALLECULA MUCOSA MARGIN, EXCISION: Benign oropharyngeal mucosa. No malignancy identified.   N. PTERYGOID, DEEP TONSILLAR FOSSA MARGIN, EXCISION: Benign soft tissue. No malignancy identified.   O. TONGUE, TOTAL GLOSSECTOMY: Invasive moderately differentiated squamous cell carcinoma, keratinizing (4.6 cm). Tumor focally present at the deep and left tissue edges, please see separated margin status.  Lymphovascular invasion identified.  Perineural invasion is identified. Pathologic stage: pT4a pN1.  See CAP synoptic report.     Patient was evaluated by radiation oncology at [facility] and plan was to offer adjuvant concurrent chemoradiation.  However patient lives in [place] and prefers to get care locally.   He also underwent tracheostomy removal, port placement and PEG tube placement at [facility].  Patient completed concurrent chemoradiation with weekly cisplatin in March 2022.    Interval history- Patient is presently doing well.he will need peg tube for nutrition for rest of his life. Speech is improving  ECOG PS- 1 Pain scale- 0  Review of systems- Review of Systems   Constitutional:  Negative for chills, fever, malaise/fatigue and weight loss.  HENT:  Negative for congestion, ear discharge and nosebleeds.   Eyes:  Negative for blurred vision.  Respiratory:  Negative for cough, hemoptysis, sputum production, shortness of breath and wheezing.   Cardiovascular:  Negative for chest pain, palpitations, orthopnea and claudication.  Gastrointestinal:  Negative for abdominal pain, blood in stool, constipation, diarrhea, heartburn, melena, nausea and vomiting.  Genitourinary:  Negative for dysuria, flank pain, frequency, hematuria and urgency.  Musculoskeletal:  Negative for back pain, joint pain and myalgias.  Skin:  Negative for rash.  Neurological:  Negative for dizziness, tingling, focal weakness, seizures, weakness and headaches.  Endo/Heme/Allergies:  Does not bruise/bleed easily.  Psychiatric/Behavioral:  Negative for depression and suicidal ideas. The patient does not have insomnia.     Allergies  Allergen Reactions   Tape     Redness and bleeding with some tapes and adhesives.     Past Medical History:  Diagnosis Date   Actinic keratosis 03/03/2010   left dorsum hand, bx proven     No past surgical history on file.  Social History   Socioeconomic History   Marital status: Widowed    Spouse name: Not on file   Number of children: Not on file   Years of education: Not on file   Highest education level: Not on file  Occupational History   Not on file  Tobacco Use   Smoking status: Former    Pack years: 0.00    Types: Cigarettes   Smokeless tobacco: Never   Tobacco comments:    60 years ago  Vaping Use   Vaping Use: Never used  Substance and Sexual Activity   Alcohol use: Not Currently   Drug use: Never   Sexual activity: Not Currently  Other Topics Concern   Not on file  Social History Narrative   Not on file   Social Determinants of Health   Financial Resource Strain: Not on file  Food Insecurity: Not on file   Transportation Needs: Not on file  Physical Activity: Not on file  Stress: Not on file  Social Connections: Not on file  Intimate Partner Violence: Not on file    No family history on file.   Current Outpatient Medications:    Ascorbic Acid (VITAMIN C) 1000 MG tablet, Take 1,000 mg by mouth daily., Disp: , Rfl:    aspirin EC 81 MG tablet, Take 81 mg by mouth daily. Swallow whole., Disp: , Rfl:    DENTAGEL 1.1 % GEL dental gel, PLEASE SEE ATTACHED FOR DETAILED DIRECTIONS, Disp: , Rfl:    enalapril (VASOTEC) 10 MG tablet, Take 10 mg by mouth daily., Disp: , Rfl:    enalapril (VASOTEC) 20 MG tablet, Take 20 mg by mouth daily., Disp: , Rfl:    glipiZIDE (GLUCOTROL) 10 MG tablet, Take 10 mg by mouth daily before breakfast., Disp: , Rfl:    lidocaine-prilocaine (EMLA) cream, Apply 1 application topically as directed. aplly small amount of cream over the port 1 1/2 hours before each treatment, place saran wrap over cream to protect the clothing, Disp: 30 g, Rfl: 2   LORazepam (ATIVAN) 0.5 MG tablet, Take 1 tablet (0.5 mg total) by mouth every 8 (eight) hours as needed for anxiety., Disp: 30 tablet, Rfl: 0   metFORMIN (GLUCOPHAGE) 500 MG tablet, Take 1,000 mg by mouth 2 (two) times daily., Disp: , Rfl:    Multiple Vitamin (MULTI-VITAMIN) tablet,  Take 1 tablet by mouth daily., Disp: , Rfl:    pravastatin (PRAVACHOL) 40 MG tablet, Take 1 tablet by mouth daily., Disp: , Rfl:    sodium fluoride (FLUORISHIELD) 1.1 % GEL dental gel, Brush and floss your teeth. Place 1 drop of gel in each tooth space of fluoride gel trays and insert trays. Wear trays each day for 10 mins at bedtime, then remove trays and spit out excess gel. Do not eat, drink, or rinse your mouth for 30 minutes after application., Disp: , Rfl:   Physical exam:  Vitals:   03/29/21 1356  BP: 102/60  Pulse: 73  Resp: 18  Temp: (!) 97.2 F (36.2 C)  TempSrc: Tympanic  SpO2: 96%   Physical Exam Constitutional:      General: He is  not in acute distress. Cardiovascular:     Rate and Rhythm: Normal rate and regular rhythm.     Heart sounds: Normal heart sounds.  Pulmonary:     Effort: Pulmonary effort is normal.     Breath sounds: Normal breath sounds.  Abdominal:     General: Bowel sounds are normal.     Palpations: Abdomen is soft.     Comments: Peg tube in place  Skin:    General: Skin is warm and dry.  Neurological:     Mental Status: He is alert and oriented to person, place, and time.     CMP Latest Ref Rng & Units 03/29/2021  Glucose 70 - 99 mg/dL 117(H)  BUN 8 - 23 mg/dL 31(H)  Creatinine 0.61 - 1.24 mg/dL 0.68  Sodium 135 - 145 mmol/L 135  Potassium 3.5 - 5.1 mmol/L 4.3  Chloride 98 - 111 mmol/L 100  CO2 22 - 32 mmol/L 26  Calcium 8.9 - 10.3 mg/dL 8.9   CBC Latest Ref Rng & Units 03/29/2021  WBC 4.0 - 10.5 K/uL 3.4(L)  Hemoglobin 13.0 - 17.0 g/dL 12.2(L)  Hematocrit 39.0 - 52.0 % 35.9(L)  Platelets 150 - 400 K/uL 173    No images are attached to the encounter.  NM PET Image Restag (PS) Skull Base To Thigh  Addendum Date: 03/09/2021   ADDENDUM REPORT: 03/09/2021 08:57 ADDENDUM: Fasting blood glucose: 132 mg/dL Electronically Signed   By: Lorin Picket M.D.   On: 03/09/2021 08:57   Result Date: 03/09/2021 CLINICAL DATA:  Subsequent treatment strategy for head/neck cancer. EXAM: NUCLEAR MEDICINE PET SKULL BASE TO THIGH TECHNIQUE: 9.8 mCi F-18 FDG was injected intravenously. Full-ring PET imaging was performed from the skull base to thigh after the radiotracer. CT data was obtained and used for attenuation correction and anatomic localization. Fasting blood glucose:  mg/dl COMPARISON:  09/16/2020. FINDINGS: Mediastinal blood pool activity: SUV max 2.1 Liver activity: SUV max NA NECK: Fullness in the right piriform sinus with very mild hypermetabolism, 3.7. Otherwise, no abnormal hypermetabolism. Incidental CT findings: None. CHEST: No hypermetabolic mediastinal, hilar or axillary lymph nodes. No  hypermetabolic pulmonary nodules. Incidental CT findings: Right IJ Port-A-Cath terminates in the right atrium. Atherosclerotic calcification of the aortic valve. Heart is at the upper limits of normal in size. No pericardial or pleural effusion. ABDOMEN/PELVIS: No abnormal hypermetabolism in the liver, adrenal glands, spleen or pancreas. Slight misregistration artifact in the left upper quadrant. No hypermetabolic lymph nodes. Mild patchy hypermetabolism in the prostate. Incidental CT findings: Liver is unremarkable. Small stones are seen in the gallbladder. Adrenal glands, kidneys, spleen, pancreas unremarkable. Percutaneous gastrostomy. Stomach and bowel otherwise grossly unremarkable. Prostate is slightly enlarged. Bladder wall  thickening may be partially due to under distension. Atherosclerotic calcification of the aorta. SKELETON: No abnormal hypermetabolism. Incidental CT findings: Degenerative changes in the spine. IMPRESSION: 1. Mild hypermetabolism and fullness in the right piriform sinus, possibly due to retained secretions. Difficult to exclude a true lesion. This should be amenable to direct visualization, as clinically indicated. No evidence of distant metastatic disease. 2. Patchy hypermetabolism within an enlarged prostate. Consider laboratory correlation, as clinically indicated. 3. Cholelithiasis. 4. Aortic atherosclerosis (ICD10-I70.0). Electronically Signed: By: Lorin Picket M.D. On: 03/09/2021 08:39     Assessment and plan- Patient is a 70 y.o. 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  He is s/p concurrent chemoradiation with weekly cisplatin. He is here to discuss pet scan results and further management  I have reviewed pet ct images independently and discussed findings with the patient. PET scan shows no concerning findings of residual disease. There was some uptake in the right piriform sinus  possibly due to retained secretions. He will be following with DR. Conley Canal at [facility]. Seen by him on 03/09/21 with no concerning findings on HP/L exam.   Patchy hypermetabolism noted on prostate. PSA today was normal at 0.27. port flush in 3 and 6 months. I will see him in 6 months with labs. If no clinical evidence of recurrence in a year, will plan for port removal.    Visit Diagnosis 1. Tongue cancer ([place])      Dr. Randa Evens, MD, MPH [facility] [phone] 04/02/2021 6:58 PM

## 2021-06-28 ENCOUNTER — Inpatient Hospital Stay: Payer: Medicare HMO

## 2021-06-28 ENCOUNTER — Inpatient Hospital Stay: Payer: Medicare HMO | Attending: Oncology

## 2021-06-28 DIAGNOSIS — Z95828 Presence of other vascular implants and grafts: Secondary | ICD-10-CM

## 2021-06-28 DIAGNOSIS — N400 Enlarged prostate without lower urinary tract symptoms: Secondary | ICD-10-CM | POA: Insufficient documentation

## 2021-06-28 DIAGNOSIS — Z87891 Personal history of nicotine dependence: Secondary | ICD-10-CM | POA: Insufficient documentation

## 2021-06-28 DIAGNOSIS — C029 Malignant neoplasm of tongue, unspecified: Secondary | ICD-10-CM | POA: Insufficient documentation

## 2021-06-28 MED ORDER — SODIUM CHLORIDE 0.9% FLUSH
10.0000 mL | INTRAVENOUS | Status: DC | PRN
  Administered 2021-06-28: 10 mL via INTRAVENOUS
  Filled 2021-06-28: qty 10

## 2021-06-28 MED ORDER — HEPARIN SOD (PORK) LOCK FLUSH 100 UNIT/ML IV SOLN
500.0000 [IU] | Freq: Once | INTRAVENOUS | Status: AC
Start: 2021-06-28 — End: 2021-06-28
  Filled 2021-06-28: qty 5

## 2021-06-28 MED ORDER — HEPARIN SOD (PORK) LOCK FLUSH 100 UNIT/ML IV SOLN
INTRAVENOUS | Status: AC
  Administered 2021-06-28: 500 [IU] via INTRAVENOUS
  Filled 2021-06-28: qty 5

## 2021-06-28 NOTE — Progress Notes (Signed)
Nutrition Follow-up:  Patient with stage IV tongue cancer followed by Dr Janese Banks.  S/p total glossectomy with bilateral neck dissection on 07/15/20 at [facility].  PEG placed on 09/22/20 at [facility].  Patient completed chemotherapy on 3/1 and radiation on 3/4.   Met with patient today after port flush.  Patient reports that he has been feeling good, cutting down trees, and working this summer.  Reports that he is giving 6 cartons of glucerna 1.5 (2 cartons TID) via feeding tube.  Gives prostat 91m BID.  Gives 637mwater flush with all feeding first, then gives prostat first and second feeding, 2 cartons formula and then flushes with 30037mater after. Last feeding of the day gives 1m57mter flush first, then formula and then 300ml50mer.  Drinks about 16 oz water daily orally, approved per SLP.  Also at times drinks broth, broth from pinto beans.    Medications: reviewed  Labs: reviewed  Anthropometrics:   Weight 177 lb 6 oz today  182 lb on 6/28 189 lb 3/1 188 lb on 2/22   Estimated Energy Needs  Kcals: 2250-2700 Protein: 112-135 g Fluid: 2.2 L  NUTRITION DIAGNOSIS: Inadequate oral intake but relying on feeding tube    INTERVENTION:  Continue glucerna 1.5, 6 cartons per day and prostat 30ml 69mand same free water flush. Continue oral water for additional hydration. Try glucerna shake (180 calories, 10 g protein) orally for added calories and protein, concerned about continued weight loss. Coupons given Tube feeding, prostat and glucerna shake will provide 2516 calories, 157 g protein, 2280ml f30mwater    MONITORING, EVALUATION, GOAL: weight trends, intake, TF   NEXT VISIT: Thursday, Jan 5th phone  Anayelli Lai B. Winslow Verrill, Zenia ResidesDNCrawfordsvilleegDarienered Dietitian [phone])

## 2021-07-27 ENCOUNTER — Telehealth: Payer: Self-pay

## 2021-07-27 NOTE — Telephone Encounter (Signed)
Nutrition  Received call from sister, Lear Ng regarding patient tube feeding formula is on back order from [facility] through the end of November at least.  [facility] can send an alternative but need a new prescription.    RD spoke with representative from [place] and they have Diabetiasource AC as an alternative.   Recommend Diabetiasource AC, 7 cartons per day (2 cartons at 8am, noon, and 4pm, 1 carton at 8pm). Diabetiasource has less calories than glucerna 1.5 and need to increase to 7 cartons to better meet needs.    Patient to continue prostat 72ml BID, no change.  Message sent to provider and nursing regarding new prescription recommendations.  New prescription should be faxed to [facility] at [phone].    Sister called and notified of change and increase in cartons per day.   Sabin Gibeault B. Zenia Resides, [place], [place] Registered Dietitian [phone] (mobile)

## 2021-08-03 ENCOUNTER — Ambulatory Visit (INDEPENDENT_AMBULATORY_CARE_PROVIDER_SITE_OTHER): Payer: Medicare HMO | Admitting: Dermatology

## 2021-08-03 ENCOUNTER — Other Ambulatory Visit: Payer: Self-pay

## 2021-08-03 DIAGNOSIS — L570 Actinic keratosis: Secondary | ICD-10-CM | POA: Diagnosis not present

## 2021-08-03 DIAGNOSIS — L578 Other skin changes due to chronic exposure to nonionizing radiation: Secondary | ICD-10-CM

## 2021-08-03 NOTE — Progress Notes (Signed)
   Follow-Up Visit   Subjective  Ronnie Buckley is a 70 y.o. male who presents for the following: Actinic Keratosis (Face, scalp, 20m f/u, [employer] 5FU/Calcipotriene cr bid x 7 days scalp, forehead).  The following portions of the chart were reviewed this encounter and updated as appropriate:   Tobacco  Allergies  Meds  Problems  Med Hx  Surg Hx  Fam Hx     Review of Systems:  No other skin or systemic complaints except as noted in HPI or Assessment and Plan.  Objective  Well appearing patient in no apparent distress; mood and affect are within normal limits.  A focused examination was performed including face, scalp. Relevant physical exam findings are noted in the Assessment and Plan.  forehead/temples x 7, scalp x 4 (11) Pink scaly macules   Assessment & Plan   Actinic Damage - chronic, secondary to cumulative UV radiation exposure/sun exposure over time - diffuse scaly erythematous macules with underlying dyspigmentation - Recommend daily broad spectrum sunscreen SPF 30+ to sun-exposed areas, reapply every 2 hours as needed.  - Recommend staying in the shade or wearing long sleeves, sun glasses (UVA+UVB protection) and wide brim hats (4-inch brim around the entire circumference of the hat). - Call for new or changing lesions.  AK (actinic keratosis) (11) forehead/temples x 7, scalp x 4  Good results with 5FU/Calcipotriene treatment  Destruction of lesion - forehead/temples x 7, scalp x 4 Complexity: simple   Destruction method: cryotherapy   Informed consent: discussed and consent obtained   Timeout:  patient name, date of birth, surgical site, and procedure verified Lesion destroyed using liquid nitrogen: Yes   Region frozen until ice ball extended beyond lesion: Yes   Outcome: patient tolerated procedure well with no complications   Post-procedure details: wound care instructions given    Return in about 6 months (around 01/31/2022) for Hx of AKs.  I,  Othelia Pulling, RMA, am acting as scribe for Sarina Ser, MD . Documentation: I have reviewed the above documentation for accuracy and completeness, and I agree with the above.  Sarina Ser, MD

## 2021-08-03 NOTE — Patient Instructions (Signed)

## 2021-08-04 ENCOUNTER — Encounter: Payer: Self-pay | Admitting: Dermatology

## 2021-08-04 ENCOUNTER — Telehealth: Payer: Self-pay

## 2021-08-04 NOTE — Telephone Encounter (Signed)
Nutrition  Received text message from sister saying that patient has not received any shipment from [facility].    RD called [facility] and spoke with Sammy. She verified that new prescription was received.  She said that shipment with supplies, new formula and protein modular should be delivered today to patient.  Message sent back to sister.   Larron Armor B. Zenia Resides, [place], [place] Registered Dietitian [phone] (mobile)

## 2021-08-11 ENCOUNTER — Encounter: Payer: Self-pay | Admitting: *Deleted

## 2021-09-14 ENCOUNTER — Ambulatory Visit: Payer: Medicare HMO | Admitting: Radiation Oncology

## 2021-09-21 ENCOUNTER — Other Ambulatory Visit: Payer: Self-pay

## 2021-09-21 ENCOUNTER — Encounter: Payer: Self-pay | Admitting: Radiation Oncology

## 2021-09-21 ENCOUNTER — Ambulatory Visit
Admission: RE | Admit: 2021-09-21 | Discharge: 2021-09-21 | Disposition: A | Discharge status: Home / Self Care | Payer: Medicare HMO | Source: Ambulatory Visit | Attending: Radiation Oncology | Admitting: Radiation Oncology

## 2021-09-21 DIAGNOSIS — Z923 Personal history of irradiation: Secondary | ICD-10-CM | POA: Insufficient documentation

## 2021-09-21 DIAGNOSIS — Z9221 Personal history of antineoplastic chemotherapy: Secondary | ICD-10-CM | POA: Insufficient documentation

## 2021-09-21 DIAGNOSIS — C029 Malignant neoplasm of tongue, unspecified: Secondary | ICD-10-CM

## 2021-09-21 DIAGNOSIS — Z85810 Personal history of malignant neoplasm of tongue: Secondary | ICD-10-CM | POA: Diagnosis present

## 2021-09-21 NOTE — Progress Notes (Signed)
Radiation Oncology Follow up Note  Name: Ronnie Buckley   Date:   09/21/2021 MRN:  [id number] DOB: November 30, 1950    This 70 y.o. 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  Seen today in routine follow-up he is doing well he is having no head and neck pain or dysphagia.  He had a PET scan back in.  June which I have reviewed showing mild hypermetabolic activity in the right piriform sinus possibly due to retained secretions.  He also had some patchy hypermetabolic activity within the enlarged prostate although his PSA was less than 1.  He continues close surveillance by ENT at [facility] and exam has shown no evidence of disease.  COMPLICATIONS OF TREATMENT: none  FOLLOW UP COMPLIANCE: keeps appointments   PHYSICAL EXAM:  BP (P) 138/67 (BP Location: Left Arm, Patient Position: Sitting)    Pulse (!) (P) 57    Temp (!) (P) 96.3 F (35.7 C) (Tympanic)    Resp (P) 16    Wt (P) 181 lb (82.1 kg)    BMI (P) 25.97 kg/m  Patient has surgical changes in his head and neck and crew including fibrosis and scar no evidence of nodularity or mass in the cervical or supraclavicular region.  Well-developed well-nourished patient in NAD. HEENT reveals PERLA, EOMI, discs not visualized.  Oral cavity is clear. No oral mucosal lesions are identified. Neck is clear without evidence of cervical or supraclavicular adenopathy. Lungs are clear to A&P. Cardiac examination is essentially unremarkable with regular rate and rhythm without murmur rub or thrill. Abdomen is benign with no organomegaly or masses noted. Motor sensory and DTR  levels are equal and symmetric in the upper and lower extremities. Cranial nerves II through XII are grossly intact. Proprioception is intact. No peripheral adenopathy or edema is identified. No motor or sensory levels are noted. Crude visual fields are within normal range.  RADIOLOGY RESULTS: PET CT scan reviewed compatible with above-stated findings  PLAN: Present time patient is now at 10 months from adjuvant chemoradiation and is doing well with no evidence of disease.  I am pleased with his overall progress.  He continues close follow-up care with medical oncology and ENT.  I have asked to see him back in 1 year for follow-up.  Patient knows to call with any concerns.  I would like to take this opportunity to thank you for allowing me to participate in the care of your patient.Noreene Filbert, MD

## 2021-09-22 ENCOUNTER — Other Ambulatory Visit: Payer: Self-pay | Admitting: *Deleted

## 2021-09-22 DIAGNOSIS — C029 Malignant neoplasm of tongue, unspecified: Secondary | ICD-10-CM

## 2021-09-30 ENCOUNTER — Inpatient Hospital Stay: Payer: Medicare HMO | Attending: Oncology

## 2021-09-30 ENCOUNTER — Other Ambulatory Visit: Payer: Self-pay

## 2021-09-30 ENCOUNTER — Encounter: Payer: Self-pay | Admitting: Oncology

## 2021-09-30 ENCOUNTER — Inpatient Hospital Stay (HOSPITAL_BASED_OUTPATIENT_CLINIC_OR_DEPARTMENT_OTHER): Payer: Medicare HMO | Admitting: Oncology

## 2021-09-30 VITALS — BP 126/72 | HR 60 | Temp 97.5°F | Resp 16 | Wt 181.7 lb

## 2021-09-30 DIAGNOSIS — Z9221 Personal history of antineoplastic chemotherapy: Secondary | ICD-10-CM | POA: Diagnosis not present

## 2021-09-30 DIAGNOSIS — Z8589 Personal history of malignant neoplasm of other organs and systems: Secondary | ICD-10-CM | POA: Diagnosis not present

## 2021-09-30 DIAGNOSIS — Z87891 Personal history of nicotine dependence: Secondary | ICD-10-CM | POA: Diagnosis not present

## 2021-09-30 DIAGNOSIS — C029 Malignant neoplasm of tongue, unspecified: Secondary | ICD-10-CM | POA: Diagnosis present

## 2021-09-30 DIAGNOSIS — Z08 Encounter for follow-up examination after completed treatment for malignant neoplasm: Secondary | ICD-10-CM | POA: Diagnosis not present

## 2021-09-30 DIAGNOSIS — Z923 Personal history of irradiation: Secondary | ICD-10-CM | POA: Diagnosis not present

## 2021-09-30 LAB — CBC WITH DIFFERENTIAL/PLATELET
Abs Immature Granulocytes: 0.01 10*3/uL (ref 0.00–0.07)
Basophils Absolute: 0 10*3/uL (ref 0.0–0.1)
Basophils Relative: 1 %
Eosinophils Absolute: 0.2 10*3/uL (ref 0.0–0.5)
Eosinophils Relative: 4 %
HCT: 36.3 % — ABNORMAL LOW (ref 39.0–52.0)
Hemoglobin: 11.9 g/dL — ABNORMAL LOW (ref 13.0–17.0)
Immature Granulocytes: 0 %
Lymphocytes Relative: 21 %
Lymphs Abs: 0.9 10*3/uL (ref 0.7–4.0)
MCH: 30.9 pg (ref 26.0–34.0)
MCHC: 32.8 g/dL (ref 30.0–36.0)
MCV: 94.3 fL (ref 80.0–100.0)
Monocytes Absolute: 0.3 10*3/uL (ref 0.1–1.0)
Monocytes Relative: 8 %
Neutro Abs: 2.7 10*3/uL (ref 1.7–7.7)
Neutrophils Relative %: 66 %
Platelets: 150 10*3/uL (ref 150–400)
RBC: 3.85 MIL/uL — ABNORMAL LOW (ref 4.22–5.81)
RDW: 13.1 % (ref 11.5–15.5)
WBC: 4.2 10*3/uL (ref 4.0–10.5)
nRBC: 0 % (ref 0.0–0.2)

## 2021-09-30 LAB — COMPREHENSIVE METABOLIC PANEL
ALT: 15 U/L (ref 0–44)
AST: 20 U/L (ref 15–41)
Albumin: 3.7 g/dL (ref 3.5–5.0)
Alkaline Phosphatase: 53 U/L (ref 38–126)
Anion gap: 8 (ref 5–15)
BUN: 28 mg/dL — ABNORMAL HIGH (ref 8–23)
CO2: 30 mmol/L (ref 22–32)
Calcium: 9 mg/dL (ref 8.9–10.3)
Chloride: 100 mmol/L (ref 98–111)
Creatinine, Ser: 0.68 mg/dL (ref 0.61–1.24)
GFR, Estimated: >60 mL/min (ref >60)
Glucose, Bld: 120 mg/dL — ABNORMAL HIGH (ref 70–99)
Potassium: 4.4 mmol/L (ref 3.5–5.1)
Sodium: 138 mmol/L (ref 135–145)
Total Bilirubin: 0.4 mg/dL (ref 0.3–1.2)
Total Protein: 7 g/dL (ref 6.5–8.1)

## 2021-09-30 MED ORDER — SODIUM CHLORIDE 0.9% FLUSH
10.0000 mL | Freq: Once | INTRAVENOUS | Status: AC
Start: 2021-09-30 — End: 2021-09-30
  Administered 2021-09-30: 13:00:00 10 mL via INTRAVENOUS
  Filled 2021-09-30: qty 10

## 2021-09-30 MED ORDER — HEPARIN SOD (PORK) LOCK FLUSH 100 UNIT/ML IV SOLN
500.0000 [IU] | Freq: Once | INTRAVENOUS | Status: AC
  Administered 2021-09-30: 13:00:00 500 [IU] via INTRAVENOUS
  Filled 2021-09-30: qty 5

## 2021-09-30 NOTE — Progress Notes (Signed)
Patient here for follow up no questions or complaints today.

## 2021-10-03 ENCOUNTER — Encounter: Payer: Self-pay | Admitting: Oncology

## 2021-10-03 NOTE — Progress Notes (Signed)
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 Conley Canal for a lesion that he identified on his left tongue.  Biopsy on 06/15/2020 was consistent with squamous cell carcinoma.  This was followed by a CT neck and chest.  CT neck showed a large poorly defined infiltrative mass throughout the posterior left oral tongue and left oral base extending to the left glossotonsillar sulcus.  The mass crosses midline through the lingual septum into the right hemitongue.  Findings are concerning for left lateral retropharyngeal as well as level 1 and 4 nodal metastases.  CT chest without contrast did not show any evidence of metastatic disease.  Patient then underwent total glossectomy with bilateral neck dissection on 07/15/2020.   Pathology showed:Surgical Pathology Final Pathologic Diagnosis  A. LYMPH NODE, LEFT LEVEL 1B, DISSECTION: Benign salivary gland tissue. One out of six lymph nodes, positive for metastatic carcinoma (1/6).   B. LYMPH NODES, LEFT LEVEL 2A, 3, & 4, DISSECTION: Sixteen lymph nodes, negative for metastatic carcinoma (0/16).   C. LYMPH NODES, LEFT LEVEL 2B, DISSECTION: Seven lymph nodes, negative for metastatic carcinoma (0/7).   D. LYMPH NODES, LEFT LEVEL 1A, DISSECTION: One lymph node, negative for metastatic carcinoma (0/1).   E. LYMPH NODES, RIGHT LEVEL 2A, 3, &4,  DISSECTION: Eleven lymph nodes, negative for metastatic carcinoma (0/11).   F. LYMPH NODES, RIGHT LEVEL 2A AND 2B, DISSECTION: Eleven lymph nodes, negative for metastatic carcinoma(0/11).   G. LINGUAL NERVE, RIGHT, EXCISION: Benign nerve tissue. No malignancy identified.   H. LYMPH NODES, RIGHT LEVEL 1B, DISSECTION: Benign salivary gland tissue. Three lymph nodes, negative for metastatic carcinoma. (0/3).   I. LINGUAL NERVE, PROXIMAL LEFT MARGIN, EXCISION: Benign peripheral nerve. No malignancy identified.   J. FLOOR OF MOUTH, LEFT MARGIN, EXCISION: Benign oropharyngeal mucosa. No malignancy identified.   K. SOFT PALATE, LEFT MARGIN, EXCISION: Benign oropharyngeal mucosa. No malignancy identified.   L. PHARYNGEAL MUCOSAL MARGIN, LEFT, EXCISION: Benign oropharyngeal mucosa. No malignancy identified.   M. VALLECULA MUCOSA MARGIN, EXCISION: Benign oropharyngeal mucosa. No malignancy identified.   N. PTERYGOID, DEEP TONSILLAR FOSSA MARGIN, EXCISION: Benign soft tissue. No malignancy identified.   O. TONGUE, TOTAL GLOSSECTOMY: Invasive moderately differentiated squamous cell carcinoma, keratinizing (4.6 cm). Tumor focally present at the deep and left tissue edges, please see separated margin status.  Lymphovascular invasion identified.  Perineural invasion is identified. Pathologic stage: pT4a pN1.  See CAP synoptic report.     Patient was evaluated by radiation oncology at [facility] and plan was to offer adjuvant concurrent chemoradiation.  However patient lives in [place] and prefers to get care locally.   He also underwent tracheostomy removal, port placement and PEG tube placement at [facility].  Patient completed concurrent chemoradiation with weekly cisplatin in March 2022.      Interval history-patient is doing well overall.  He follows up with [facility] ENT and so far there have been no concerns of recurrent disease.  He will  have a permanent PEG tube due  to his swallowing issues postsurgery.  ECOG PS- 1 Pain scale- 0   Review of systems- Review of Systems  Constitutional:  Negative for chills, fever, malaise/fatigue and weight loss.  HENT:  Negative for congestion, ear discharge and nosebleeds.   Eyes:  Negative for blurred vision.  Respiratory:  Negative for cough, hemoptysis, sputum production, shortness of breath and wheezing.   Cardiovascular:  Negative for chest pain, palpitations, orthopnea and claudication.  Gastrointestinal:  Negative for abdominal pain, blood in stool, constipation, diarrhea, heartburn, melena, nausea and vomiting.  Genitourinary:  Negative for dysuria, flank pain, frequency, hematuria and urgency.  Musculoskeletal:  Negative for back pain, joint pain and myalgias.  Skin:  Negative for rash.  Neurological:  Negative for dizziness, tingling, focal weakness, seizures, weakness and headaches.  Endo/Heme/Allergies:  Does not bruise/bleed easily.  Psychiatric/Behavioral:  Negative for depression and suicidal ideas. The patient does not have insomnia.       Allergies  Allergen Reactions   Tape     Redness and bleeding with some tapes and adhesives.     Past Medical History:  Diagnosis Date   Actinic keratosis 03/03/2010   left dorsum hand, bx proven     History reviewed. No pertinent surgical history.  Social History   Socioeconomic History   Marital status: Widowed    Spouse name: Not on file   Number of children: Not on file   Years of education: Not on file   Highest education level: Not on file  Occupational History   Not on file  Tobacco Use   Smoking status: Former    Types: Cigarettes   Smokeless tobacco: Never   Tobacco comments:    60 years ago  Vaping Use   Vaping Use: Never used  Substance and Sexual Activity   Alcohol use: Not Currently   Drug use: Never   Sexual activity: Not Currently  Other Topics Concern   Not on file  Social History Narrative   Not on file   Social  Determinants of Health   Financial Resource Strain: Not on file  Food Insecurity: Not on file  Transportation Needs: Not on file  Physical Activity: Not on file  Stress: Not on file  Social Connections: Not on file  Intimate Partner Violence: Not on file    History reviewed. No pertinent family history.   Current Outpatient Medications:    Ascorbic Acid (VITAMIN C) 1000 MG tablet, Take 1,000 mg by mouth daily., Disp: , Rfl:    aspirin EC 81 MG tablet, Take 81 mg by mouth daily. Swallow whole., Disp: , Rfl:    DENTAGEL 1.1 % GEL dental gel, PLEASE SEE ATTACHED FOR DETAILED DIRECTIONS, Disp: , Rfl:    enalapril (VASOTEC) 5 MG tablet, Take 5 mg by mouth daily., Disp: , Rfl:    glipiZIDE (GLUCOTROL) 10 MG tablet, Take 10 mg by mouth daily before breakfast., Disp: , Rfl:    LORazepam (ATIVAN) 0.5 MG tablet, Take 1 tablet (0.5 mg total) by mouth every 8 (eight) hours as needed for anxiety., Disp: 30 tablet, Rfl: 0   metFORMIN (GLUCOPHAGE) 500 MG tablet, Take 1,000 mg by mouth 2 (two) times daily., Disp: , Rfl:    Multiple Vitamin (MULTI-VITAMIN) tablet, Take 1 tablet by mouth daily., Disp: , Rfl:    pravastatin (PRAVACHOL) 40 MG tablet, Take 1 tablet by mouth daily., Disp: , Rfl:   Physical exam:  Vitals:   09/30/21 1321  BP: 126/72  Pulse: 60  Resp: 16  Temp: (!) 97.5 F (36.4 C)  TempSrc: Tympanic  Weight: 181 lb 11.2 oz (82.4 kg)   Physical Exam Constitutional:      General: He is not in acute distress. HENT:     Mouth/Throat:     Mouth: Mucous membranes are moist.     Pharynx: Oropharynx is clear.     Comments: Changes of glossectomy and reconstruction seen Cardiovascular:     Rate and Rhythm: Normal rate and regular rhythm.     Heart sounds: Normal heart sounds.  Pulmonary:     Effort: Pulmonary effort is normal.     Breath sounds: Normal breath sounds.  Abdominal:     General: Bowel sounds are normal.     Palpations: Abdomen is soft.     Comments: PEG tube in  place  Musculoskeletal:     Cervical back: Normal range of motion.  Lymphadenopathy:     Comments: No palpable cervical adenopathy. Chronic induration and skin thickening from prior radiation  Skin:    General: Skin is warm and dry.  Neurological:     Mental Status: He is alert and oriented to person, place, and time.     CMP Latest Ref Rng & Units 09/30/2021  Glucose 70 - 99 mg/dL 120(H)  BUN 8 - 23 mg/dL 28(H)  Creatinine 0.61 - 1.24 mg/dL 0.68  Sodium 135 - 145 mmol/L 138  Potassium 3.5 - 5.1 mmol/L 4.4  Chloride 98 - 111 mmol/L 100  CO2 22 - 32 mmol/L 30  Calcium 8.9 - 10.3 mg/dL 9.0  Total Protein 6.5 - 8.1 g/dL 7.0  Total Bilirubin 0.3 - 1.2 mg/dL 0.4  Alkaline Phos 38 - 126 U/L 53  AST 15 - 41 U/L 20  ALT 0 - 44 U/L 15   CBC Latest Ref Rng & Units 09/30/2021  WBC 4.0 - 10.5 K/uL 4.2  Hemoglobin 13.0 - 17.0 g/dL 11.9(L)  Hematocrit 39.0 - 52.0 % 36.3(L)  Platelets 150 - 400 K/uL 150   Assessment and plan- Patient is a 71 y.o. 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  Indicated patient is doing well with no concerning signs and symptoms of recurrence based on today's exam.  He also continues to follow-up with ENT and has not had any evidence of local recurrence.  No indication for surveillance imaging at this time.  I have asked him to reach out to [facility] and we will reach out to them as well regarding his [employer].  He may have a PEG tube foam and clean place given his history of glossectomy and aspiration risk.  I will see him back in 6 months with labs   Visit Diagnosis 1. Encounter for follow-up surveillance of head and neck cancer      Dr. Randa Evens, MD, MPH [facility] [phone] 10/03/2021 10:18 AM

## 2021-10-06 ENCOUNTER — Telehealth: Payer: Self-pay | Admitting: *Deleted

## 2021-10-06 ENCOUNTER — Inpatient Hospital Stay: Payer: Medicare HMO | Attending: Oncology

## 2021-10-06 NOTE — Telephone Encounter (Addendum)
Later today I got message from [facility] that pt wants go back on glucerna since it is back in stock. I faxed the order to [facility] and got fax transmission that it went through.

## 2021-10-06 NOTE — Telephone Encounter (Addendum)
Called the patient and let him know that I got an appt. For January 10 at 2:15. The phone number to surgery center is [phone]. The address is [address] on [address].  Then the appt with Dr. Conley Canal is jan 11,  at 10:20 The pt. Knows how to get to Dr. [facility] office because he has been there many times.  Pt wanted me to send him a text with the appts and the directions to surgery. I have sent it to his phone I also sent ref. To [facility] with order to d/c port and last visit progress note with order. I rcvd fax transmission that it went through

## 2021-10-06 NOTE — Progress Notes (Signed)
Nutrition Follow-up:  Patient with stage IV tongue cancer followed by Dr Janese Banks.  S/p total glossectomy with bilateral neck dissection on 07/15/20 at [facility].  PEG placed on 09/22/20 at [facility].  Patient completed chemotherapy on 3/1 and radiation on 3/4.    Nutrition follow-up completed by text per appointment request.  Patient has been tolerating 7 cartons of Diabetasource AC, 7 cartons per day due to back order of glucerna 1.5.  (Gives 3 cartons at breakfast, 2 cartons at lunch and 2 cartons at dinner.  Continues prostat BID (breakfast and lunch feeding).  Sips water, hot tea, broth orally (~14 oz daily).  Gives 2oz of water (45ml) before feeding, 2 oz after medication, 2 oz of water after protein and 2 oz water after formula then 4-6 oz water after feeding 3 times per day.   Says that he has been giving glucerna shake (retail) via tube as well.   Reports that the Diabetasource causes more constipation and prefers the glucerna 1.5.  Denies any other issues with tube feeding.   Medications: reviewed  Labs: Na 138, glucose 120, BUN 28, creatinine 0.68  Anthropometrics:   Weight 181 lb on 12/30  177 lb on 9/27 182 lb on 6/28 189 lb on 3/1 188 lb on 2/22   Re-Estimated Energy Needs  Kcals: 2500-2800 Protein: 123-164 g (1.5-2 g/kg) Fluid: 2500-2800  NUTRITION DIAGNOSIS: Inadequate oral intake but relying on feeding tube   INTERVENTION:  Spoke with [place] at [facility] 1.5 is back in stock but will need new prescription.   Recommend glucerna 1.5, 7 cartons per day (3 cartons at breakfast, 2 cartons at lunch and dinner per patient preference) to better meet needs.  Breakfast feeding flush 39ml water (2oz) first, then 65ml after medication, 53ml after protein, and 139ml (4 oz) after formula.   Lunch and dinner flush 45ml first, 54ml with medication, 167ml (3 syringes) water after formula  Prostat decreased to 32ml daily. Give with first feeding. Tube feeding and prostat provides 2592  calories, 152 g protein and 2160 ml free water (plus 452ml orally) for total of 2594ml of fluid daily.   Continue to drink ~14 oz daily of thin liquids per SLP recommendations.  If unable to drink this amount orally will need to put this amount of water in tube for adequate hydration. Written instructions sent to patient.   Discussed with provider and nursing and will send new prescription to [facility] for glucerna 1.5, 7 cartons daily and prostat 51ml daily.     MONITORING, EVALUATION, GOAL: weight trends, tube feeding   NEXT VISIT: Thursday, March 30 after port flush  Treniece Holsclaw B. Zenia Resides, [place], [place] Registered Dietitian [phone] (mobile)

## 2021-10-20 ENCOUNTER — Telehealth: Payer: Self-pay

## 2021-10-20 NOTE — Telephone Encounter (Signed)
Nutrition  Called [facility] to verifiy that they had received new tube feeding orders for glucerna 1.5 and prostat.  Spoke with [place] with [facility] and he confirmed that he received new tube feeding orders for glucerna 1.5, 7 cartons per day and prostat 1ml daily. Sent a text message to patient updating him and patient is appreciative.   Stella Encarnacion B. Zenia Resides, [place], [place] Registered Dietitian [phone] (mobile)

## 2021-11-23 ENCOUNTER — Other Ambulatory Visit: Payer: Self-pay

## 2021-11-23 ENCOUNTER — Telehealth: Payer: Self-pay

## 2021-11-23 ENCOUNTER — Inpatient Hospital Stay: Payer: Medicare HMO | Attending: Oncology

## 2021-11-23 ENCOUNTER — Telehealth: Payer: Self-pay | Admitting: *Deleted

## 2021-11-23 NOTE — Telephone Encounter (Signed)
-----   Message from Jennet Maduro, [place] sent at 11/23/2021  2:56 PM EST ----- [facility] (DME for enteral supplies) needs a new script for prostat (protein modular) that Ronnie Buckley is on because the current "packets" of prostat are on back order.  [facility] will not send bottles unless they have a new prescription.    Can you please fax new prescription to [phone]  Prostat 32ml (bottle) two times per day via feeding tube or equivalent product  Thank you, Joli

## 2021-11-23 NOTE — Progress Notes (Signed)
See progress note   Ronnie Buckley B. Zenia Resides, [place], [place] Registered Dietitian [phone] (mobile)

## 2021-11-23 NOTE — Telephone Encounter (Signed)
Nutrition  Received text message from patient [facility] (DME for enteral supplies) saying that prostat is on back order and not currently receiving it with tube feeding formula.  Also glucerna 1.5 is still on back order and patient receiving Diabetasource, 7 cartons daily.   RD called [facility] and spoke with Ai. [facility] does have bottle of prostat but packets on are back order. [facility] needs a new prescription sent to be able to send bottle of prostat to patient.  [phone]).   Patient will need prostat 11ml BID (bottle) daily due to diabetasource having less protein than glucerna 1.5.  Message sent to MD and team for new prescription to be faxed.    Informed patient.   Sindee Stucker B. Zenia Resides, [place], [place] Registered Dietitian [phone] (mobile)

## 2021-11-23 NOTE — Telephone Encounter (Signed)
Order for new feeding tube sent to [facility] and the fax transmission went through to [phone].

## 2021-12-19 IMAGING — CT NM PET TUM IMG RESTAG (PS) SKULL BASE T - THIGH
1 of 9 series · 1 of 25 positions shown · non-contrast
Comparison: 09/16/2020.
COMPARISON: 09/16/2020.

Addendum:
CLINICAL DATA: Subsequent treatment strategy for head/neck cancer.

EXAM:
NUCLEAR MEDICINE PET SKULL BASE TO THIGH
TECHNIQUE: 9.8 mCi F-18 FDG was injected intravenously. Full-ring PET imaging
was performed from the skull base to thigh after the radiotracer. CT
data was obtained and used for attenuation correction and anatomic
localization.
Fasting blood glucose:  mg/dl

[Series 3: ct wb 5.0 b30f · axial · 5.0mm · 0.98mm/px · 1 of 329 slices shown]
[im 329/329  brain]
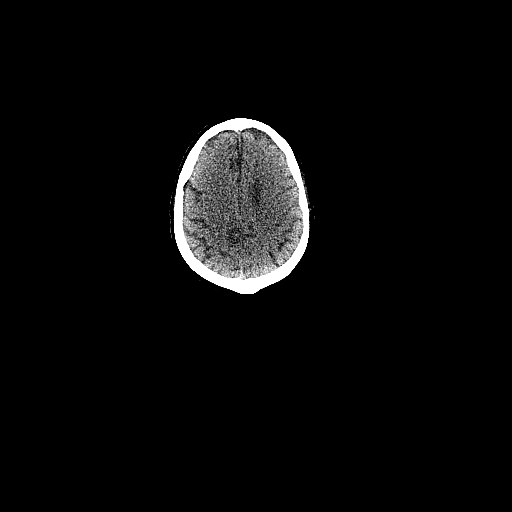

[1 of 25 positions shown; findings below may reference images not displayed]

FINDINGS: Mediastinal blood pool activity: SUV max

Liver activity: SUV max NA

NECK:

Fullness in the right piriform sinus with very mild hypermetabolism,
3.7. Otherwise, no abnormal hypermetabolism.

Incidental CT findings:

None.

CHEST:

No hypermetabolic mediastinal, hilar or axillary lymph nodes. No
hypermetabolic pulmonary nodules.

Incidental CT findings:

Right IJ Port-A-Cath terminates in the right atrium. Atherosclerotic
calcification of the aortic valve. Heart is at the upper limits of
normal in size. No pericardial or pleural effusion.

ABDOMEN/PELVIS:

No abnormal hypermetabolism in the liver, adrenal glands, spleen or
pancreas. Slight misregistration artifact in the left upper
quadrant. No hypermetabolic lymph nodes. Mild patchy hypermetabolism
in the prostate.

Incidental CT findings:

Liver is unremarkable. Small stones are seen in the gallbladder.
Adrenal glands, kidneys, spleen, pancreas unremarkable. Percutaneous
gastrostomy. Stomach and bowel otherwise grossly unremarkable.
Prostate is slightly enlarged. Bladder wall thickening may be
partially due to under distension. Atherosclerotic calcification of
the aorta.

SKELETON:

No abnormal hypermetabolism.

Incidental CT findings:

Degenerative changes in the spine.
IMPRESSION: 1. Mild hypermetabolism and fullness in the right piriform sinus,
possibly due to retained secretions. Difficult to exclude a true
lesion. This should be amenable to direct visualization, as
clinically indicated. No evidence of distant metastatic disease.
2. Patchy hypermetabolism within an enlarged prostate. Consider
laboratory correlation, as clinically indicated.
3. Cholelithiasis.
4. Aortic atherosclerosis ([id number]-ZIN.N).

ADDENDUM:
Fasting blood glucose: 132 mg/dL

*** End of Addendum ***
FINDINGS: Mediastinal blood pool activity: SUV max

Liver activity: SUV max NA

NECK:

Fullness in the right piriform sinus with very mild hypermetabolism,
3.7. Otherwise, no abnormal hypermetabolism.

Incidental CT findings:

None.

CHEST:

No hypermetabolic mediastinal, hilar or axillary lymph nodes. No
hypermetabolic pulmonary nodules.

Incidental CT findings:

Right IJ Port-A-Cath terminates in the right atrium. Atherosclerotic
calcification of the aortic valve. Heart is at the upper limits of
normal in size. No pericardial or pleural effusion.

ABDOMEN/PELVIS:

No abnormal hypermetabolism in the liver, adrenal glands, spleen or
pancreas. Slight misregistration artifact in the left upper
quadrant. No hypermetabolic lymph nodes. Mild patchy hypermetabolism
in the prostate.

Incidental CT findings:

Liver is unremarkable. Small stones are seen in the gallbladder.
Adrenal glands, kidneys, spleen, pancreas unremarkable. Percutaneous
gastrostomy. Stomach and bowel otherwise grossly unremarkable.
Prostate is slightly enlarged. Bladder wall thickening may be
partially due to under distension. Atherosclerotic calcification of
the aorta.

SKELETON:

No abnormal hypermetabolism.

Incidental CT findings:

Degenerative changes in the spine.
IMPRESSION: 1. Mild hypermetabolism and fullness in the right piriform sinus,
possibly due to retained secretions. Difficult to exclude a true
lesion. This should be amenable to direct visualization, as
clinically indicated. No evidence of distant metastatic disease.
2. Patchy hypermetabolism within an enlarged prostate. Consider
laboratory correlation, as clinically indicated.
3. Cholelithiasis.
4. Aortic atherosclerosis (R2I3Y-ZIN.N).

## 2021-12-29 ENCOUNTER — Inpatient Hospital Stay: Payer: Medicare HMO | Attending: Oncology

## 2021-12-29 NOTE — Progress Notes (Signed)
Nutrition Follow-up: ? 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  S/p total glossectomy with bilateral neck dissection on 07/15/20 at [facility].  PEG placed on 09/22/20 at [facility]  Patient completed chemotherapy on 3/1 and radiation on 3/4. ? ?Met with patient in clinic.  Patient reports that he has been doing ok.  Has been having trouble consistently getting glucerna 1.5.  [place] has had to substitute diabetisource. Also was out of prostat for about 12 days in Feb.  Tube feeding formulas shortages are currently going on nationwide.  Patient currently receiving glucerna 1.5, 7 cartons per day (Gives 3 cartons at breakfast, 2 cartons at lunch and 2 cartons at dinner).  Gives prostat 53m BID.  Water flush that same as before.  Patient drinks oral glucerna shake about 3-4 times per week.  Drinks hot tea, water and broth orally as well.   ? ?Reports constipation while on diabetasource formula but this is currently resolved as receiving glucerna 1.5.   ? ?Says that he put out 100 bales of pine needles in his yard recently.  Likes to be outside and active ? ?Medications: reviewed ? ?Labs: no new at cancer center ? ?Anthropometrics:  ? ?Weight 179 lb 1 oz today ?181 lb on 12/30 ?177 lb on 9/27 ?182 lb on 6/28 ?189 lb on 3/1 ?188 lb on 2/22 ? ? ? ?Estimated Energy Needs ? ?Kcals: [address]?Protein: 123-164 g (1.5-2.0 g/kg) ?Fluid: [address]? ?NUTRITION DIAGNOSIS: Inadequate oral intake relying on feeding tube ? ? ? ?INTERVENTION:  ?Continue glucerna 1.5, 7 cartons per day.  Water flush same as previously discussed on other visits. Continue prostat 33mBID ?Tube feeding provides 2692 calories, 167 g protein, 216026mree water (+ oral intake of 420m41motal water 2580ml61mContinue oral intake per SLP recommendations.   ?Patient has RD contact information  ?  ? ?MONITORING, EVALUATION, GOAL: weight trends, tube feeding ? ? ?NEXT VISIT: Thursday, July 13 in clinic ? ?Ronnie Karel B. AllenZenia Resides LDN ?Registered Dietitian ?[phone]  [phone]?

## 2022-02-09 ENCOUNTER — Ambulatory Visit: Payer: Medicare HMO | Admitting: Dermatology

## 2022-03-16 ENCOUNTER — Ambulatory Visit (INDEPENDENT_AMBULATORY_CARE_PROVIDER_SITE_OTHER): Payer: Medicare HMO | Admitting: Dermatology

## 2022-03-16 DIAGNOSIS — L820 Inflamed seborrheic keratosis: Secondary | ICD-10-CM | POA: Diagnosis not present

## 2022-03-16 DIAGNOSIS — D692 Other nonthrombocytopenic purpura: Secondary | ICD-10-CM

## 2022-03-16 DIAGNOSIS — L570 Actinic keratosis: Secondary | ICD-10-CM

## 2022-03-16 DIAGNOSIS — L578 Other skin changes due to chronic exposure to nonionizing radiation: Secondary | ICD-10-CM | POA: Diagnosis not present

## 2022-03-16 NOTE — Patient Instructions (Signed)
Due to recent changes in healthcare laws, you may see results of your pathology and/or laboratory studies on MyChart before the doctors have had a chance to review them. We understand that in some cases there may be results that are confusing or concerning to you. Please understand that not all results are received at the same time and often the doctors may need to interpret multiple results in order to provide you with the best plan of care or course of treatment. Therefore, we ask that you please give us 2 business days to thoroughly review all your results before contacting the office for clarification. Should we see a critical lab result, you will be contacted sooner.   If You Need Anything After Your Visit  If you have any questions or concerns for your doctor, please call our main line at [phone] and press option 4 to reach your doctor's medical assistant. If no one answers, please leave a voicemail as directed and we will return your call as soon as possible. Messages left after 4 pm will be answered the following business day.   You may also send us a message via MyChart. We typically respond to MyChart messages within 1-2 business days.  For prescription refills, please ask your pharmacy to contact our office. Our fax number is [phone].  If you have an urgent issue when the clinic is closed that cannot wait until the next business day, you can page your doctor at the number below.    Please note that while we do our best to be available for urgent issues outside of office hours, we are not available 24/7.   If you have an urgent issue and are unable to reach us, you may choose to seek medical care at your doctor's office, retail clinic, urgent care center, or emergency room.  If you have a medical emergency, please immediately call 911 or go to the emergency department.  Pager Numbers  - Dr. Kowalski: [phone]  - Dr. Moye: [phone]  - Dr. Stewart:  [phone]  In the event of inclement weather, please call our main line at [phone] for an update on the status of any delays or closures.  Dermatology Medication Tips: Please keep the boxes that topical medications come in in order to help keep track of the instructions about where and how to use these. Pharmacies typically print the medication instructions only on the boxes and not directly on the medication tubes.   If your medication is too expensive, please contact our office at [phone] option 4 or send us a message through MyChart.   We are unable to tell what your co-pay for medications will be in advance as this is different depending on your insurance coverage. However, we may be able to find a substitute medication at lower cost or fill out paperwork to get insurance to cover a needed medication.   If a prior authorization is required to get your medication covered by your insurance company, please allow us 1-2 business days to complete this process.  Drug prices often vary depending on where the prescription is filled and some pharmacies may offer cheaper prices.  The website www.goodrx.com contains coupons for medications through different pharmacies. The prices here do not account for what the cost may be with help from insurance (it may be cheaper with your insurance), but the website can give you the price if you did not use any insurance.  - You can print the associated coupon and take it with   your prescription to the pharmacy.  - You may also stop by our office during regular business hours and pick up a GoodRx coupon card.  - If you need your prescription sent electronically to a different pharmacy, notify our office through [facility] MyChart or by phone at [phone] option 4.     Si Usted Necesita Algo Despus de Su Visita  Tambin puede enviarnos un mensaje a travs de MyChart. Por lo general respondemos a los mensajes de MyChart en el transcurso de 1 a 2  das hbiles.  Para renovar recetas, por favor pida a su farmacia que se ponga en contacto con nuestra oficina. Nuestro nmero de fax es el [phone].  Si tiene un asunto urgente cuando la clnica est cerrada y que no puede esperar hasta el siguiente da hbil, puede llamar/localizar a su doctor(a) al nmero que aparece a continuacin.   Por favor, tenga en cuenta que aunque hacemos todo lo posible para estar disponibles para asuntos urgentes fuera del horario de oficina, no estamos disponibles las 24 horas del da, los 7 das de la semana.   Si tiene un problema urgente y no puede comunicarse con nosotros, puede optar por buscar atencin mdica  en el consultorio de su doctor(a), en una clnica privada, en un centro de atencin urgente o en una sala de emergencias.  Si tiene una emergencia mdica, por favor llame inmediatamente al 911 o vaya a la sala de emergencias.  Nmeros de bper  - Dr. Kowalski: [phone]  - Dra. Moye: [phone]  - Dra. Stewart: [phone]  En caso de inclemencias del tiempo, por favor llame a nuestra lnea principal al [phone] para una actualizacin sobre el estado de cualquier retraso o cierre.  Consejos para la medicacin en dermatologa: Por favor, guarde las cajas en las que vienen los medicamentos de uso tpico para ayudarle a seguir las instrucciones sobre dnde y cmo usarlos. Las farmacias generalmente imprimen las instrucciones del medicamento slo en las cajas y no directamente en los tubos del medicamento.   Si su medicamento es muy caro, por favor, pngase en contacto con nuestra oficina llamando al [phone] y presione la opcin 4 o envenos un mensaje a travs de MyChart.   No podemos decirle cul ser su copago por los medicamentos por adelantado ya que esto es diferente dependiendo de la cobertura de su seguro. Sin embargo, es posible que podamos encontrar un medicamento sustituto a menor costo o llenar un formulario para que el  seguro cubra el medicamento que se considera necesario.   Si se requiere una autorizacin previa para que su compaa de seguros cubra su medicamento, por favor permtanos de 1 a 2 das hbiles para completar este proceso.  Los precios de los medicamentos varan con frecuencia dependiendo del lugar de dnde se surte la receta y alguna farmacias pueden ofrecer precios ms baratos.  El sitio web www.goodrx.com tiene cupones para medicamentos de diferentes farmacias. Los precios aqu no tienen en cuenta lo que podra costar con la ayuda del seguro (puede ser ms barato con su seguro), pero el sitio web puede darle el precio si no utiliz ningn seguro.  - Puede imprimir el cupn correspondiente y llevarlo con su receta a la farmacia.  - Tambin puede pasar por nuestra oficina durante el horario de atencin regular y recoger una tarjeta de cupones de GoodRx.  - Si necesita que su receta se enve electrnicamente a una farmacia diferente, informe a nuestra oficina a travs de MyChart de [facility]   o por telfono llamando al [phone] y presione la opcin 4.  

## 2022-03-16 NOTE — Progress Notes (Unsigned)
   Follow-Up Visit   Subjective  Ronnie Buckley is a 71 y.o. male who presents for the following: Actinic Keratosis (Of the face and scalp - patient is here today to check for any new or persistent precancerous skin lesions). The patient has spots, moles and lesions to be evaluated, some may be new or changing and the patient has concerns that these could be cancer.  The following portions of the chart were reviewed this encounter and updated as appropriate:   Tobacco  Allergies  Meds  Problems  Med Hx  Surg Hx  Fam Hx     Review of Systems:  No other skin or systemic complaints except as noted in HPI or Assessment and Plan.  Objective  Well appearing patient in no apparent distress; mood and affect are within normal limits.  A focused examination was performed including face, scalp, arms. Relevant physical exam findings are noted in the Assessment and Plan.  Face, ears, scalp x 8 (8) Erythematous thin papules/macules with gritty scale.   Scalp x 17 (17) Erythematous stuck-on, waxy papule or plaque   Assessment & Plan  AK (actinic keratosis) (8) Face, ears, scalp x 8  Destruction of lesion - Face, ears, scalp x 8 Complexity: simple   Destruction method: cryotherapy   Informed consent: discussed and consent obtained   Timeout:  patient name, date of birth, surgical site, and procedure verified Lesion destroyed using liquid nitrogen: Yes   Region frozen until ice ball extended beyond lesion: Yes   Outcome: patient tolerated procedure well with no complications   Post-procedure details: wound care instructions given    Inflamed seborrheic keratosis (17) Scalp x 17  Destruction of lesion - Scalp x 17 Complexity: simple   Destruction method: cryotherapy   Informed consent: discussed and consent obtained   Timeout:  patient name, date of birth, surgical site, and procedure verified Lesion destroyed using liquid nitrogen: Yes   Region frozen until ice ball extended  beyond lesion: Yes   Outcome: patient tolerated procedure well with no complications   Post-procedure details: wound care instructions given    Actinic Damage - chronic, secondary to cumulative UV radiation exposure/sun exposure over time - diffuse scaly erythematous macules with underlying dyspigmentation - Recommend daily broad spectrum sunscreen SPF 30+ to sun-exposed areas, reapply every 2 hours as needed.  - Recommend staying in the shade or wearing long sleeves, sun glasses (UVA+UVB protection) and wide brim hats (4-inch brim around the entire circumference of the hat). - Call for new or changing lesions.  Purpura - Chronic; persistent and recurrent.  Treatable, but not curable. - Violaceous macules and patches - Benign - Related to trauma, age, sun damage and/or use of blood thinners, chronic use of topical and/or oral steroids - Observe - Can use OTC arnica containing moisturizer such as Dermend Bruise Formula if desired - Call for worsening or other concerns  Return for [place] follow up in 8-12 mths.  Luther Redo, CMA, am acting as scribe for Sarina Ser, MD . Documentation: I have reviewed the above documentation for accuracy and completeness, and I agree with the above.  Sarina Ser, MD

## 2022-03-21 ENCOUNTER — Encounter: Payer: Self-pay | Admitting: Dermatology

## 2022-03-31 ENCOUNTER — Inpatient Hospital Stay: Payer: Medicare HMO | Attending: Oncology

## 2022-03-31 ENCOUNTER — Inpatient Hospital Stay (HOSPITAL_BASED_OUTPATIENT_CLINIC_OR_DEPARTMENT_OTHER): Payer: Medicare HMO | Admitting: Oncology

## 2022-03-31 ENCOUNTER — Encounter: Payer: Self-pay | Admitting: Oncology

## 2022-03-31 VITALS — BP 122/60 | HR 69 | Temp 96.8°F | Ht 70.0 in | Wt 184.1 lb

## 2022-03-31 DIAGNOSIS — Z923 Personal history of irradiation: Secondary | ICD-10-CM | POA: Insufficient documentation

## 2022-03-31 DIAGNOSIS — Z87891 Personal history of nicotine dependence: Secondary | ICD-10-CM | POA: Diagnosis not present

## 2022-03-31 DIAGNOSIS — Z08 Encounter for follow-up examination after completed treatment for malignant neoplasm: Secondary | ICD-10-CM

## 2022-03-31 DIAGNOSIS — C029 Malignant neoplasm of tongue, unspecified: Secondary | ICD-10-CM | POA: Insufficient documentation

## 2022-03-31 DIAGNOSIS — Z8589 Personal history of malignant neoplasm of other organs and systems: Secondary | ICD-10-CM

## 2022-03-31 DIAGNOSIS — Z9221 Personal history of antineoplastic chemotherapy: Secondary | ICD-10-CM | POA: Insufficient documentation

## 2022-03-31 LAB — CBC WITH DIFFERENTIAL/PLATELET
Abs Immature Granulocytes: 0.02 10*3/uL (ref 0.00–0.07)
Basophils Absolute: 0 10*3/uL (ref 0.0–0.1)
Basophils Relative: 1 %
Eosinophils Absolute: 0.2 10*3/uL (ref 0.0–0.5)
Eosinophils Relative: 5 %
HCT: 38.7 % — ABNORMAL LOW (ref 39.0–52.0)
Hemoglobin: 12.9 g/dL — ABNORMAL LOW (ref 13.0–17.0)
Immature Granulocytes: 1 %
Lymphocytes Relative: 24 %
Lymphs Abs: 1 10*3/uL (ref 0.7–4.0)
MCH: 32.8 pg (ref 26.0–34.0)
MCHC: 33.3 g/dL (ref 30.0–36.0)
MCV: 98.5 fL (ref 80.0–100.0)
Monocytes Absolute: 0.4 10*3/uL (ref 0.1–1.0)
Monocytes Relative: 10 %
Neutro Abs: 2.6 10*3/uL (ref 1.7–7.7)
Neutrophils Relative %: 59 %
Platelets: 156 10*3/uL (ref 150–400)
RBC: 3.93 MIL/uL — ABNORMAL LOW (ref 4.22–5.81)
RDW: 13.4 % (ref 11.5–15.5)
WBC: 4.3 10*3/uL (ref 4.0–10.5)
nRBC: 0 % (ref 0.0–0.2)

## 2022-03-31 LAB — COMPREHENSIVE METABOLIC PANEL
ALT: 16 U/L (ref 0–44)
AST: 21 U/L (ref 15–41)
Albumin: 3.7 g/dL (ref 3.5–5.0)
Alkaline Phosphatase: 55 U/L (ref 38–126)
Anion gap: 6 (ref 5–15)
BUN: 34 mg/dL — ABNORMAL HIGH (ref 8–23)
CO2: 29 mmol/L (ref 22–32)
Calcium: 8.8 mg/dL — ABNORMAL LOW (ref 8.9–10.3)
Chloride: 103 mmol/L (ref 98–111)
Creatinine, Ser: 0.72 mg/dL (ref 0.61–1.24)
GFR, Estimated: >60 mL/min (ref >60)
Glucose, Bld: 103 mg/dL — ABNORMAL HIGH (ref 70–99)
Potassium: 4.9 mmol/L (ref 3.5–5.1)
Sodium: 138 mmol/L (ref 135–145)
Total Bilirubin: 0.5 mg/dL (ref 0.3–1.2)
Total Protein: 6.8 g/dL (ref 6.5–8.1)

## 2022-03-31 NOTE — Progress Notes (Signed)
Survivorship Care Plan visit completed.  Treatment summary reviewed and given to patient.  ASCO answers booklet reviewed and given to patient.  CARE program and Cancer Transitions discussed with patient along with other resources cancer center offers to patients and caregivers.  Patient verbalized understanding.    

## 2022-03-31 NOTE — Progress Notes (Signed)
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 Conley Canal for a lesion that he identified on his left tongue.  Biopsy on 06/15/2020 was consistent with squamous cell carcinoma.  This was followed by a CT neck and chest.  CT neck showed a large poorly defined infiltrative mass throughout the posterior left oral tongue and left oral base extending to the left glossotonsillar sulcus.  The mass crosses midline through the lingual septum into the right hemitongue.  Findings are concerning for left lateral retropharyngeal as well as level 1 and 4 nodal metastases.  CT chest without contrast did not show any evidence of metastatic disease.  Patient then underwent total glossectomy with bilateral neck dissection on 07/15/2020.   Pathology showed:Surgical Pathology Final Pathologic Diagnosis  A. LYMPH NODE, LEFT LEVEL 1B, DISSECTION: Benign salivary gland tissue. One out of six lymph nodes, positive for metastatic carcinoma (1/6).   B. LYMPH NODES, LEFT LEVEL 2A, 3, & 4, DISSECTION: Sixteen lymph nodes, negative for metastatic carcinoma (0/16).   C. LYMPH NODES, LEFT LEVEL 2B, DISSECTION: Seven lymph nodes, negative for metastatic carcinoma (0/7).   D. LYMPH NODES, LEFT LEVEL 1A, DISSECTION: One lymph node, negative for metastatic carcinoma (0/1).   E. LYMPH NODES, RIGHT LEVEL 2A,  3, &4, DISSECTION: Eleven lymph nodes, negative for metastatic carcinoma (0/11).   F. LYMPH NODES, RIGHT LEVEL 2A AND 2B, DISSECTION: Eleven lymph nodes, negative for metastatic carcinoma(0/11).   G. LINGUAL NERVE, RIGHT, EXCISION: Benign nerve tissue. No malignancy identified.   H. LYMPH NODES, RIGHT LEVEL 1B, DISSECTION: Benign salivary gland tissue. Three lymph nodes, negative for metastatic carcinoma. (0/3).   I. LINGUAL NERVE, PROXIMAL LEFT MARGIN, EXCISION: Benign peripheral nerve. No malignancy identified.   J. FLOOR OF MOUTH, LEFT MARGIN, EXCISION: Benign oropharyngeal mucosa. No malignancy identified.   K. SOFT PALATE, LEFT MARGIN, EXCISION: Benign oropharyngeal mucosa. No malignancy identified.   L. PHARYNGEAL MUCOSAL MARGIN, LEFT, EXCISION: Benign oropharyngeal mucosa. No malignancy identified.   M. VALLECULA MUCOSA MARGIN, EXCISION: Benign oropharyngeal mucosa. No malignancy identified.   N. PTERYGOID, DEEP TONSILLAR FOSSA MARGIN, EXCISION: Benign soft tissue. No malignancy identified.   O. TONGUE, TOTAL GLOSSECTOMY: Invasive moderately differentiated squamous cell carcinoma, keratinizing (4.6 cm). Tumor focally present at the deep and left tissue edges, please see separated margin status.  Lymphovascular invasion identified.  Perineural invasion is identified. Pathologic stage: pT4a pN1.  See CAP synoptic report.     Patient was evaluated by radiation oncology at [facility] and plan was to offer adjuvant concurrent chemoradiation.  However patient lives in [place] and prefers to get care locally.   He also underwent tracheostomy removal, port placement and PEG tube placement at [facility].  Patient completed concurrent chemoradiation with weekly cisplatin in March 2022.    Interval history- Patient is doing well overall.  He will continue to rely on PEG tube feeds for his primary nutrition and drinks mainly juices p.o.  denies any specific complaints  at this time   ECOG PS- 1 Pain scale- 0   Review of systems- Review of Systems  Constitutional:  Negative for chills, fever, malaise/fatigue and weight loss.  HENT:  Negative for congestion, ear discharge and nosebleeds.   Eyes:  Negative for blurred vision.  Respiratory:  Negative for cough, hemoptysis, sputum production, shortness of breath and wheezing.   Cardiovascular:  Negative for chest pain, palpitations, orthopnea and claudication.  Gastrointestinal:  Negative for abdominal pain, blood in stool, constipation, diarrhea, heartburn, melena, nausea and vomiting.  Genitourinary:  Negative for dysuria, flank pain, frequency, hematuria and urgency.  Musculoskeletal:  Negative for back pain, joint pain and myalgias.  Skin:  Negative for rash.  Neurological:  Negative for dizziness, tingling, focal weakness, seizures, weakness and headaches.  Endo/Heme/Allergies:  Does not bruise/bleed easily.  Psychiatric/Behavioral:  Negative for depression and suicidal ideas. The patient does not have insomnia.       Allergies  Allergen Reactions   Tape     Redness and bleeding with some tapes and adhesives.     Past Medical History:  Diagnosis Date   Actinic keratosis 03/03/2010   left dorsum hand, bx proven     History reviewed. No pertinent surgical history.  Social History   Socioeconomic History   Marital status: Widowed    Spouse name: Not on file   Number of children: Not on file   Years of education: Not on file   Highest education level: Not on file  Occupational History   Not on file  Tobacco Use   Smoking status: Former    Types: Cigarettes   Smokeless tobacco: Never   Tobacco comments:    60 years ago  Vaping Use   Vaping Use: Never used  Substance and Sexual Activity   Alcohol use: Not Currently   Drug use: Never   Sexual activity: Not Currently  Other Topics Concern   Not on file  Social History Narrative   Not on file   Social Determinants of Health    Financial Resource Strain: Not on file  Food Insecurity: Not on file  Transportation Needs: Not on file  Physical Activity: Not on file  Stress: Not on file  Social Connections: Not on file  Intimate Partner Violence: Not on file    History reviewed. No pertinent family history.   Current Outpatient Medications:    Ascorbic Acid (VITAMIN C) 1000 MG tablet, Take 1,000 mg by mouth daily., Disp: , Rfl:    DENTAGEL 1.1 % GEL dental gel, PLEASE SEE ATTACHED FOR DETAILED DIRECTIONS, Disp: , Rfl:    enalapril (VASOTEC) 5 MG tablet, Take 5 mg by mouth daily., Disp: , Rfl:    glipiZIDE (GLUCOTROL) 10 MG tablet, Take 10 mg by mouth daily before breakfast., Disp: , Rfl:    LORazepam (ATIVAN) 0.5 MG tablet, Take 1 tablet (0.5 mg total) by mouth every 8 (eight) hours as needed for anxiety., Disp: 30 tablet, Rfl: 0   metFORMIN (GLUCOPHAGE) 500 MG tablet, Take 1,000 mg by mouth 2 (two) times daily., Disp: , Rfl:    Multiple Vitamin (MULTI-VITAMIN) tablet, Take 1 tablet by mouth daily., Disp: , Rfl:    pravastatin (PRAVACHOL) 40 MG tablet, Take 1 tablet by mouth daily., Disp: , Rfl:   Physical exam:  Vitals:   03/31/22 1350  BP: 122/60  Pulse: 69  Temp: (!) 96.8 F (36 C)  TempSrc: Tympanic  SpO2: 99%  Weight: 184 lb 1.6 oz (  83.5 kg)  Height: '5\' 10"'$  (1.778 m)   Physical Exam Cardiovascular:     Rate and Rhythm: Normal rate and regular rhythm.     Heart sounds: Normal heart sounds.  Pulmonary:     Effort: Pulmonary effort is normal.     Breath sounds: Normal breath sounds.  Abdominal:     General: Bowel sounds are normal.     Palpations: Abdomen is soft.     Comments: PEG tube in place  Musculoskeletal:     Cervical back: Normal range of motion.  Lymphadenopathy:     Comments: No palpable cervical adenopathy  Skin:    General: Skin is warm and dry.  Neurological:     Mental Status: He is alert and oriented to person, place, and time.         Latest Ref Rng & Units  03/31/2022    1:46 PM  CMP  Glucose 70 - 99 mg/dL 103   BUN 8 - 23 mg/dL 34   Creatinine 0.61 - 1.24 mg/dL 0.72   Sodium 135 - 145 mmol/L 138   Potassium 3.5 - 5.1 mmol/L 4.9   Chloride 98 - 111 mmol/L 103   CO2 22 - 32 mmol/L 29   Calcium 8.9 - 10.3 mg/dL 8.8   Total Protein 6.5 - 8.1 g/dL 6.8   Total Bilirubin 0.3 - 1.2 mg/dL 0.5   Alkaline Phos 38 - 126 U/L 55   AST 15 - 41 U/L 21   ALT 0 - 44 U/L 16       Latest Ref Rng & Units 03/31/2022    1:46 PM  CBC  WBC 4.0 - 10.5 K/uL 4.3   Hemoglobin 13.0 - 17.0 g/dL 12.9   Hematocrit 39.0 - 52.0 % 38.7   Platelets 150 - 400 K/uL 156     No images are attached to the encounter.  No results found.   Assessment and plan- Patient is a 71 y.o. 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  This is a routine follow-up visit  Clinically patient is doing well with no concerning signs and symptoms of recurrence based on today's exam.  He also had a CT neck and a CT chest without contrast which did not show any evidence ofRecurrent or progressive disease.  CT scan did show bilateral lower lobe and right middle lobe groundglass opacities concerning for aspiration.  I will see him back in 6 months and with lab     Visit Diagnosis 1. Encounter for follow-up surveillance of head and neck cancer      Dr. Randa Evens, MD, MPH [facility] [phone] 03/31/2022 4:07 PM

## 2022-04-13 ENCOUNTER — Inpatient Hospital Stay: Payer: Medicare HMO | Attending: Oncology

## 2022-04-13 NOTE — Progress Notes (Signed)
Nutrition Follow-up:  Patient with stage IV tongue cancer followed by Dr Janese Banks.  S/p total glossectomy with bilateral neck dissection on 07/15/20 at [facility].  PEG placed on 09/22/20 at [facility].  Patient completed chemotherapy on 3/1 and radiation on 3/4.    Met with patient in clinic today. Reports that he is doing well.  Working in yard which he enjoys.  Has been receiving glucerna 1.5, 7 cartons per day (3 cartons for breakfast 2 cartons at lunch and 2 cartons at dinner).  Gives prostat 71m BID but was only sent 350mdaily for this month.  Water flush same as before.  Has been adding more water due to sweating and working outside during the summer months.  Reports regular bowel movement daily since being back on glucerna 1.5. Likes glucerna better than diabetasource.  Sipping on some liquids orally (water, tea).  Denies nausea.      Medications: reviewed, MVI, Vit C, added calcium/Vit D tablet  Labs: reviewed  Anthropometrics:   183 lb 1 oz today  179 lb 1 oz on 3/30 181 lb on 09/30/21 177 lb on 9/27 182 lb on 6/28 189 lb on 3/1 188 lb on 2/22   Estimated Energy Needs  Kcals: 2500-2800 Protein: 123-164 g (1.5-2.0 g/kg) Fluid: 2500-2800  NUTRITION DIAGNOSIS: Inadequate oral intake relying on feeding tube   INTERVENTION:  Continue glucerna 1.5, 7 cartons per day.  Prostat 309m-2 times daily via tube.   Patient has contact information    MONITORING, EVALUATION, GOAL: weight trends, tube feeding   NEXT VISIT: Jan 3rd after MD visit  Leinaala Catanese B. AllZenia ResidesD,ArthurDNAmherstgistered Dietitian [phone]

## 2022-04-21 ENCOUNTER — Telehealth: Payer: Self-pay

## 2022-04-21 NOTE — Telephone Encounter (Signed)
Nutrition  Patient let RD know via text message that [facility] only has prescription for 1 prostat daily and if needing to take 2 per day will need new prescription.     With 7 cartons of glucerna 1.5 and prostat 70m daily will provide 2592 calories, 152 g protein (meets needs).    Patient also with weight gain recently.    Reviewed with patient to continue glucerna 1.5, 7 cartons daily and 35mof prostat daily at this time.    Will check weight Jan 3rd at next visit.   Patient to contact RD sooner if needed.  Jinny Sweetland B. AlZenia ResidesRDFort GarlandLDSullivan's Islandegistered Dietitian [phone]

## 2022-04-24 ENCOUNTER — Other Ambulatory Visit: Payer: Self-pay

## 2022-05-02 ENCOUNTER — Other Ambulatory Visit: Payer: Self-pay

## 2022-07-17 NOTE — Progress Notes (Signed)
Nutrition  Patient sent text message requesting new prescription for Diabeticsource tube feeding because [facility] currently is not able to get glucerna.    Recommend diabeticsource, 7 cartons per day (3 cartons for breakfast, 2 cartons at lunch and dinner).  Patient will continue prostat 93m daily  Provides 2200 calories, 120 g protein.  Message sent to MD and nursing for new prescription to be faxed ([phone].    Shakedra Beam B. AZenia Resides RCushing LHelena Valley SoutheastRegistered Dietitian [phone]

## 2022-07-19 ENCOUNTER — Telehealth: Payer: Self-pay | Admitting: *Deleted

## 2022-07-19 NOTE — Telephone Encounter (Signed)
Pt has tube feeding and his is on back order and and Joli said he needs to use diabeticsource 7 a day. Here is the note: Please fax new prescription for Diabeticsource 7 cartons per day (3 cartons for breakfast, 2 carton for lunch and 2 cartons for dinner) via feeding tube.    Fax number [phone].    I faxed the form and it went through fax.

## 2022-07-21 NOTE — Progress Notes (Signed)
Nutrition  Received text message from patient saying that [facility] needed clarification of prescription.    RD called [facility] and spoke with Georgie Chard, representative for [employer].  Required information needed on prescription is as follows  Method of administration: bolus Route of administration: via tube  Discussed with nursing and will update prescription and refax.    Azuri Bozard B. Zenia Resides, [place], [place] Registered Dietitian [phone]

## 2022-07-28 ENCOUNTER — Telehealth: Payer: Self-pay | Admitting: *Deleted

## 2022-07-28 NOTE — Telephone Encounter (Signed)
Pt says he got a call yest. That the orders for the new formula is still not right. We are missing that pt uses g tube and he gets bolus feeds. I told the pt that I spoke with them yesterday and I was told that all is good with that. I called today and I was told that all the orders are in but they requested notes from MD and the dietician notes to get approval for the diabeticsource which is a different feeding for pt. Because the other formula ison 4 month back order. I sent a message to him about the above info . He wanted me to test him because it is hard for him to talk.

## 2022-09-20 ENCOUNTER — Ambulatory Visit: Payer: Medicare HMO | Admitting: Radiation Oncology

## 2022-10-04 ENCOUNTER — Inpatient Hospital Stay: Payer: Medicare HMO

## 2022-10-04 ENCOUNTER — Inpatient Hospital Stay: Payer: Medicare HMO | Attending: Oncology | Admitting: Oncology

## 2022-10-04 ENCOUNTER — Encounter: Payer: Self-pay | Admitting: Oncology

## 2022-10-04 ENCOUNTER — Ambulatory Visit
Admission: RE | Admit: 2022-10-04 | Discharge: 2022-10-04 | Disposition: A | Discharge status: Home / Self Care | Payer: Medicare HMO | Source: Ambulatory Visit | Attending: Radiation Oncology | Admitting: Radiation Oncology

## 2022-10-04 ENCOUNTER — Encounter: Payer: Self-pay | Admitting: Radiation Oncology

## 2022-10-04 VITALS — BP 116/65 | HR 63 | Temp 97.5°F | Resp 16 | Wt 191.0 lb

## 2022-10-04 DIAGNOSIS — Z85810 Personal history of malignant neoplasm of tongue: Secondary | ICD-10-CM | POA: Insufficient documentation

## 2022-10-04 DIAGNOSIS — C029 Malignant neoplasm of tongue, unspecified: Secondary | ICD-10-CM

## 2022-10-04 DIAGNOSIS — Z923 Personal history of irradiation: Secondary | ICD-10-CM | POA: Insufficient documentation

## 2022-10-04 DIAGNOSIS — D509 Iron deficiency anemia, unspecified: Secondary | ICD-10-CM | POA: Diagnosis not present

## 2022-10-04 DIAGNOSIS — R1310 Dysphagia, unspecified: Secondary | ICD-10-CM | POA: Insufficient documentation

## 2022-10-04 DIAGNOSIS — Z9221 Personal history of antineoplastic chemotherapy: Secondary | ICD-10-CM | POA: Insufficient documentation

## 2022-10-04 DIAGNOSIS — Z8589 Personal history of malignant neoplasm of other organs and systems: Secondary | ICD-10-CM

## 2022-10-04 DIAGNOSIS — Z87891 Personal history of nicotine dependence: Secondary | ICD-10-CM | POA: Diagnosis not present

## 2022-10-04 LAB — CBC WITH DIFFERENTIAL/PLATELET
Abs Immature Granulocytes: 0.01 10*3/uL (ref 0.00–0.07)
Basophils Absolute: 0 10*3/uL (ref 0.0–0.1)
Basophils Relative: 1 %
Eosinophils Absolute: 0.1 10*3/uL (ref 0.0–0.5)
Eosinophils Relative: 3 %
HCT: 37.6 % — ABNORMAL LOW (ref 39.0–52.0)
Hemoglobin: 12.9 g/dL — ABNORMAL LOW (ref 13.0–17.0)
Immature Granulocytes: 0 %
Lymphocytes Relative: 21 %
Lymphs Abs: 1 10*3/uL (ref 0.7–4.0)
MCH: 33.1 pg (ref 26.0–34.0)
MCHC: 34.3 g/dL (ref 30.0–36.0)
MCV: 96.4 fL (ref 80.0–100.0)
Monocytes Absolute: 0.5 10*3/uL (ref 0.1–1.0)
Monocytes Relative: 10 %
Neutro Abs: 3 10*3/uL (ref 1.7–7.7)
Neutrophils Relative %: 65 %
Platelets: 153 10*3/uL (ref 150–400)
RBC: 3.9 MIL/uL — ABNORMAL LOW (ref 4.22–5.81)
RDW: 12.4 % (ref 11.5–15.5)
WBC: 4.6 10*3/uL (ref 4.0–10.5)
nRBC: 0 % (ref 0.0–0.2)

## 2022-10-04 LAB — COMPREHENSIVE METABOLIC PANEL
ALT: 13 U/L (ref 0–44)
AST: 20 U/L (ref 15–41)
Albumin: 3.8 g/dL (ref 3.5–5.0)
Alkaline Phosphatase: 56 U/L (ref 38–126)
Anion gap: 10 (ref 5–15)
BUN: 28 mg/dL — ABNORMAL HIGH (ref 8–23)
CO2: 28 mmol/L (ref 22–32)
Calcium: 8.7 mg/dL — ABNORMAL LOW (ref 8.9–10.3)
Chloride: 100 mmol/L (ref 98–111)
Creatinine, Ser: 0.66 mg/dL (ref 0.61–1.24)
GFR, Estimated: >60 mL/min (ref >60)
Glucose, Bld: 122 mg/dL — ABNORMAL HIGH (ref 70–99)
Potassium: 4.2 mmol/L (ref 3.5–5.1)
Sodium: 138 mmol/L (ref 135–145)
Total Bilirubin: 0.5 mg/dL (ref 0.3–1.2)
Total Protein: 7.3 g/dL (ref 6.5–8.1)

## 2022-10-04 NOTE — Progress Notes (Signed)
Nutrition Follow-up:  Patient with stage IV tongue cancer followed by Dr Janese Banks.  S/p total glossectomy with bilateral neck dissection on 07/15/20 at [facility]  PEG placed on 09/22/20 at [facility].  Patient completed chemotherapy on 11/30/20 and radiation on 12/03/20.    Met with patient in clinic for follow-up.  Patient tolerating Diabetasource ([facility] says glucerna 1.5 is on back order) 3 cartons at breakfast, 2 cartons at lunch and 2 cartons at dinner.  Gives prostat 74m at breakfast feeding only.  Gives 370mof water, protein, 4023mf water formula and 240m32m breakfast feeding.  Lunch and dinner feeding gives 30-40ml31mwater formula and 240ml 12mr.  Drinks 12-20 oz of water/tea/coffee daily.  Takes miralax daily to prevent constipation.  Denies problems or concerns with tube feeding.      Medications: reviewed  Labs: reviewed  Anthropometrics:   Weight 191 lb today (wearing coat)  Less active in the winter  183 lb on 7/13 179 lb 1 oz on 12/29/21 181 lb on 09/21/21   Estimated Energy Needs  Kcals: 2150-2580 Protein: 108-129 g Fluid: 2150-2580 ml  NUTRITION DIAGNOSIS: Inadequate oral intake, relying on feeding tube    INTERVENTION:  Continue diabetisource, 7 cartons daily (3 at breakfast, 2 at lunch, 2 at dinner). May be able to reduce carton if weight continues to increase  Continue prostat 30ml d36m Tube feeding provides 2200 calories, 120 g protein and 2278-2328 ml free water Patient receives tube feeding from [facility] plenty of supply.      MONITORING, EVALUATION, GOAL: weight trends, tube feeding    NEXT VISIT: Wed, July 10th after MD  Devony Mcgrady B. Jakeb Lamping, Zenia ResidesDNSummerfieldegWestmontered Dietitian [phone]

## 2022-10-04 NOTE — Progress Notes (Signed)
Radiation Oncology Follow up Note  Name: Ronnie Buckley   Date:   10/04/2022 MRN:  [id number] DOB: 08-14-1951    This 72 y.o. male presents to the clinic today for 2-year follow-up status post concurrent chemoradiation therapy for stage IV squamous cell carcinoma the base of tongue status post surgical resection.  REFERRING PROVIDER: Tracie Harrier, MD  HPI: Patient is a 72 year old male now out over 2 years having completed concurrent chemoradiation therapy for stage IV squamous cell carcinoma the base of tongue status post surgical resection and adjuvant chemoradiation.  He is seen today in routine follow-up he is doing well he is having no head and neck pain still majority of his nutrition is from a feeding tube has continued dysphagia.Marland Kitchen  His last PET scan was back in June 2022 showing no evidence of hypermetabolic activity.  COMPLICATIONS OF TREATMENT: none  FOLLOW UP COMPLIANCE: keeps appointments   PHYSICAL EXAM:  There were no vitals taken for this visit. Patient has sequela of prior head and neck surgery.  Neck is clear without evidence of cervical or supraclavicular adenopathy.  Does have a functioning feeding tube present.  Well-developed well-nourished patient in NAD. HEENT reveals PERLA, EOMI, discs not visualized.  Oral cavity is clear. No oral mucosal lesions are identified. Neck is clear without evidence of cervical or supraclavicular adenopathy. Lungs are clear to A&P. Cardiac examination is essentially unremarkable with regular rate and rhythm without murmur rub or thrill. Abdomen is benign with no organomegaly or masses noted. Motor sensory and DTR levels are equal and symmetric in the upper and lower extremities. Cranial nerves II through XII are grossly intact. Proprioception is intact. No peripheral adenopathy or edema is identified. No motor or sensory levels are noted. Crude visual fields are within normal range.  RADIOLOGY RESULTS: No current films to review  PLAN:  The present time I will turn follow-up care over to both ENT and medical oncology.  Be happy to reevaluate the patient the future should that be indicated.  He continues twice year visits with ENT surgeons.  Patient is to call with any concerns.  I would like to take this opportunity to thank you for allowing me to participate in the care of your patient.Noreene Filbert, MD

## 2022-10-04 NOTE — Progress Notes (Unsigned)
Pt in for follow up, reports doing well, weight is up from last visit.  Pt has feeding tube and only orally takes in liquids. Denies any concerns.

## 2022-10-04 NOTE — Progress Notes (Unsigned)
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 Conley Canal for a lesion that he identified on his left tongue.  Biopsy on 06/15/2020 was consistent with squamous cell carcinoma.  This was followed by a CT neck and chest.  CT neck showed a large poorly defined infiltrative mass throughout the posterior left oral tongue and left oral base extending to the left glossotonsillar sulcus.  The mass crosses midline through the lingual septum into the right hemitongue.  Findings are concerning for left lateral retropharyngeal as well as level 1 and 4 nodal metastases.  CT chest without contrast did not show any evidence of metastatic disease.  Patient then underwent total glossectomy with bilateral neck dissection on 07/15/2020.   Pathology showed:Surgical Pathology Final Pathologic Diagnosis  A. LYMPH NODE, LEFT LEVEL 1B, DISSECTION: Benign salivary gland tissue. One out of six lymph nodes, positive for metastatic carcinoma (1/6).   B. LYMPH NODES, LEFT LEVEL 2A, 3, & 4, DISSECTION: Sixteen lymph nodes, negative for metastatic carcinoma (0/16).   C. LYMPH NODES, LEFT LEVEL 2B, DISSECTION: Seven lymph nodes, negative for metastatic carcinoma (0/7).   D. LYMPH NODES, LEFT LEVEL 1A, DISSECTION: One lymph node, negative for metastatic carcinoma (0/1).   E. LYMPH NODES, RIGHT LEVEL 2A, 3, &4,  DISSECTION: Eleven lymph nodes, negative for metastatic carcinoma (0/11).   F. LYMPH NODES, RIGHT LEVEL 2A AND 2B, DISSECTION: Eleven lymph nodes, negative for metastatic carcinoma(0/11).   G. LINGUAL NERVE, RIGHT, EXCISION: Benign nerve tissue. No malignancy identified.   H. LYMPH NODES, RIGHT LEVEL 1B, DISSECTION: Benign salivary gland tissue. Three lymph nodes, negative for metastatic carcinoma. (0/3).   I. LINGUAL NERVE, PROXIMAL LEFT MARGIN, EXCISION: Benign peripheral nerve. No malignancy identified.   J. FLOOR OF MOUTH, LEFT MARGIN, EXCISION: Benign oropharyngeal mucosa. No malignancy identified.   K. SOFT PALATE, LEFT MARGIN, EXCISION: Benign oropharyngeal mucosa. No malignancy identified.   L. PHARYNGEAL MUCOSAL MARGIN, LEFT, EXCISION: Benign oropharyngeal mucosa. No malignancy identified.   M. VALLECULA MUCOSA MARGIN, EXCISION: Benign oropharyngeal mucosa. No malignancy identified.   N. PTERYGOID, DEEP TONSILLAR FOSSA MARGIN, EXCISION: Benign soft tissue. No malignancy identified.   O. TONGUE, TOTAL GLOSSECTOMY: Invasive moderately differentiated squamous cell carcinoma, keratinizing (4.6 cm). Tumor focally present at the deep and left tissue edges, please see separated margin status.  Lymphovascular invasion identified.  Perineural invasion is identified. Pathologic stage: pT4a pN1.  See CAP synoptic report.     Patient was evaluated by radiation oncology at [facility] and plan was to offer adjuvant concurrent chemoradiation.  However patient lives in [place] and prefers to get care locally.   He also underwent tracheostomy removal, port placement and PEG tube placement at [facility].  Patient completed concurrent chemoradiation with weekly cisplatin in March 2022.    Interval history- patient is doing well. Speech is stable. G tube is functioning well.   ECOG PS- 1 Pain scale- 0  Review of systems- Review  of Systems  Constitutional:  Negative  for chills, fever, malaise/fatigue and weight loss.  HENT:  Negative for congestion, ear discharge and nosebleeds.   Eyes:  Negative for blurred vision.  Respiratory:  Negative for cough, hemoptysis, sputum production, shortness of breath and wheezing.   Cardiovascular:  Negative for chest pain, palpitations, orthopnea and claudication.  Gastrointestinal:  Negative for abdominal pain, blood in stool, constipation, diarrhea, heartburn, melena, nausea and vomiting.  Genitourinary:  Negative for dysuria, flank pain, frequency, hematuria and urgency.  Musculoskeletal:  Negative for back pain, joint pain and myalgias.  Skin:  Negative for rash.  Neurological:  Negative for dizziness, tingling, focal weakness, seizures, weakness and headaches.  Endo/Heme/Allergies:  Does not bruise/bleed easily.  Psychiatric/Behavioral:  Negative for depression and suicidal ideas. The patient does not have insomnia.       Allergies  Allergen Reactions   Tape     Redness and bleeding with some tapes and adhesives.     Past Medical History:  Diagnosis Date   Actinic keratosis 03/03/2010   left dorsum hand, bx proven     History reviewed. No pertinent surgical history.  Social History   Socioeconomic History   Marital status: Widowed    Spouse name: Not on file   Number of children: Not on file   Years of education: Not on file   Highest education level: Not on file  Occupational History   Not on file  Tobacco Use   Smoking status: Former    Types: Cigarettes   Smokeless tobacco: Never   Tobacco comments:    60 years ago  Vaping Use   Vaping Use: Never used  Substance and Sexual Activity   Alcohol use: Not Currently   Drug use: Never   Sexual activity: Not Currently  Other Topics Concern   Not on file  Social History Narrative   Not on file   Social Determinants of Health   Financial Resource Strain: Not on file  Food Insecurity: Not on file  Transportation Needs: Not on file   Physical Activity: Not on file  Stress: Not on file  Social Connections: Not on file  Intimate Partner Violence: Not on file    History reviewed. No pertinent family history.   Current Outpatient Medications:    Ascorbic Acid (VITAMIN C) 1000 MG tablet, Take 1,000 mg by mouth daily., Disp: , Rfl:    DENTAGEL 1.1 % GEL dental gel, PLEASE SEE ATTACHED FOR DETAILED DIRECTIONS, Disp: , Rfl:    enalapril (VASOTEC) 5 MG tablet, Take 5 mg by mouth daily., Disp: , Rfl:    glipiZIDE (GLUCOTROL) 10 MG tablet, Take 10 mg by mouth daily before breakfast., Disp: , Rfl:    LORazepam (ATIVAN) 0.5 MG tablet, Take 1 tablet (0.5 mg total) by mouth every 8 (eight) hours as needed for anxiety., Disp: 30 tablet, Rfl: 0   metFORMIN (GLUCOPHAGE) 500 MG tablet, Take 1,000 mg by mouth daily. Take two once daily, Disp: , Rfl:    Multiple Vitamin (MULTI-VITAMIN) tablet, Take 1 tablet by mouth daily., Disp: , Rfl:    pravastatin (PRAVACHOL) 40 MG tablet, Take 1 tablet by mouth daily., Disp: , Rfl:   Physical exam:  Vitals:   10/04/22 1319  BP: 116/65  Pulse: 63  Resp: 16  Temp: (!) 97.5 F (36.4 C)  TempSrc: Tympanic  Weight: 191 lb (86.6 kg)   Physical Exam HENT:     Mouth/Throat:     Comments: Changes of prior glossectomy Cardiovascular:  Rate and Rhythm: Normal rate and regular rhythm.     Heart sounds: Normal heart sounds.  Pulmonary:     Effort: Pulmonary effort is normal.     Breath sounds: Normal breath sounds.  Abdominal:     General: Bowel sounds are normal.     Palpations: Abdomen is soft.     Comments: G tube in place  Lymphadenopathy:     Comments: No palpable cervical adenopathy  Skin:    General: Skin is warm and dry.  Neurological:     Mental Status: He is alert and oriented to person, place, and time.         Latest Ref Rng & Units 10/04/2022   12:44 PM  CMP  Glucose 70 - 99 mg/dL 122   BUN 8 - 23 mg/dL 28   Creatinine 0.61 - 1.24 mg/dL 0.66   Sodium 135 - 145  mmol/L 138   Potassium 3.5 - 5.1 mmol/L 4.2   Chloride 98 - 111 mmol/L 100   CO2 22 - 32 mmol/L 28   Calcium 8.9 - 10.3 mg/dL 8.7   Total Protein 6.5 - 8.1 g/dL 7.3   Total Bilirubin 0.3 - 1.2 mg/dL 0.5   Alkaline Phos 38 - 126 U/L 56   AST 15 - 41 U/L 20   ALT 0 - 44 U/L 13       Latest Ref Rng & Units 10/04/2022   12:44 PM  CBC  WBC 4.0 - 10.5 K/uL 4.6   Hemoglobin 13.0 - 17.0 g/dL 12.9   Hematocrit 39.0 - 52.0 % 37.6   Platelets 150 - 400 K/uL 153      Assessment and plan- Patient is a 72 y.o. 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   This is a routine f/u visit  Clinically patient is doing well without any signs and symptoms of recurrence on todays exam. He is getting NPL exams with ENT as well. I will see him back in 6 months    Visit Diagnosis 1. Tongue cancer ([place])   2. Iron deficiency anemia, unspecified iron deficiency anemia type      Dr. Randa Evens, MD, MPH [facility] [phone] 10/04/2022 3:29 PM

## 2022-10-05 ENCOUNTER — Encounter: Payer: Self-pay | Admitting: Oncology

## 2022-10-19 ENCOUNTER — Telehealth: Payer: Self-pay | Admitting: *Deleted

## 2022-10-19 NOTE — Telephone Encounter (Signed)
Pt texted me stating that the glucerna is now available. He wants me to order it for him through his [facility]. I got the form today and sent it off and sent text to pt. That the fax went through

## 2023-02-07 ENCOUNTER — Other Ambulatory Visit: Payer: Self-pay | Admitting: Hematology and Oncology

## 2023-03-21 ENCOUNTER — Ambulatory Visit (INDEPENDENT_AMBULATORY_CARE_PROVIDER_SITE_OTHER): Payer: Medicare HMO | Admitting: Dermatology

## 2023-03-21 ENCOUNTER — Encounter: Payer: Self-pay | Admitting: Dermatology

## 2023-03-21 VITALS — BP 120/62 | HR 71

## 2023-03-21 DIAGNOSIS — D489 Neoplasm of uncertain behavior, unspecified: Secondary | ICD-10-CM

## 2023-03-21 DIAGNOSIS — I872 Venous insufficiency (chronic) (peripheral): Secondary | ICD-10-CM

## 2023-03-21 DIAGNOSIS — Z872 Personal history of diseases of the skin and subcutaneous tissue: Secondary | ICD-10-CM

## 2023-03-21 DIAGNOSIS — W908XXA Exposure to other nonionizing radiation, initial encounter: Secondary | ICD-10-CM | POA: Diagnosis not present

## 2023-03-21 DIAGNOSIS — D225 Melanocytic nevi of trunk: Secondary | ICD-10-CM

## 2023-03-21 DIAGNOSIS — Z79899 Other long term (current) drug therapy: Secondary | ICD-10-CM

## 2023-03-21 DIAGNOSIS — L821 Other seborrheic keratosis: Secondary | ICD-10-CM

## 2023-03-21 DIAGNOSIS — L578 Other skin changes due to chronic exposure to nonionizing radiation: Secondary | ICD-10-CM

## 2023-03-21 DIAGNOSIS — D229 Melanocytic nevi, unspecified: Secondary | ICD-10-CM

## 2023-03-21 DIAGNOSIS — D692 Other nonthrombocytopenic purpura: Secondary | ICD-10-CM

## 2023-03-21 DIAGNOSIS — L570 Actinic keratosis: Secondary | ICD-10-CM

## 2023-03-21 DIAGNOSIS — Z1283 Encounter for screening for malignant neoplasm of skin: Secondary | ICD-10-CM | POA: Diagnosis not present

## 2023-03-21 DIAGNOSIS — L814 Other melanin hyperpigmentation: Secondary | ICD-10-CM

## 2023-03-21 DIAGNOSIS — D1801 Hemangioma of skin and subcutaneous tissue: Secondary | ICD-10-CM

## 2023-03-21 DIAGNOSIS — Z7189 Other specified counseling: Secondary | ICD-10-CM

## 2023-03-21 DIAGNOSIS — Z5111 Encounter for antineoplastic chemotherapy: Secondary | ICD-10-CM

## 2023-03-21 HISTORY — DX: Melanocytic nevi, unspecified: D22.9

## 2023-03-21 NOTE — Patient Instructions (Addendum)
Start Cream treatment to scalp in month    - Start 5-fluorouracil/calcipotriene cream twice a day for 10 days to affected areas including top of scalp. Prescription sent to [facility]. Patient advised they will receive an email to purchase the medication online and have it sent to their home. Patient provided with handout reviewing treatment course and side effects and advised to call or message [place] on MyChart with any concerns.  Reviewed course of treatment and expected reaction.  Patient advised to expect inflammation and crusting and advised that erosions are possible.  Patient advised to be diligent with sun protection during and after treatment. Counseled to keep medication out of reach of children and pets.    Instructions for [facility]  One or more of your medications was sent to the [facility]. You will receive an email from them and can purchase the medicine through that link. It will then be mailed to your home at the address you confirmed. If for any reason you do not receive an email from them, please check your spam folder. If you still do not find the email, please let us know. [facility] phone number is [phone].        5-Fluorouracil/Calcipotriene Patient Education   Actinic keratoses are the dry, red scaly spots on the skin caused by sun damage. A portion of these spots can turn into skin cancer with time, and treating them can help prevent development of skin cancer.   Treatment of these spots requires removal of the defective skin cells. There are various ways to remove actinic keratoses, including freezing with liquid nitrogen, treatment with creams, or treatment with a blue light procedure in the office.   5-fluorouracil cream is a topical cream used to treat actinic keratoses. It works by interfering with the growth of abnormal fast-growing skin cells, such as actinic keratoses.  These cells peel off and are replaced by healthy ones.   5-fluorouracil/calcipotriene is a combination of the 5-fluorouracil cream with a vitamin D analog cream called calcipotriene. The calcipotriene alone does not treat actinic keratoses. However, when it is combined with 5-fluorouracil, it helps the 5-fluorouracil treat the actinic keratoses much faster so that the same results can be achieved with a much shorter treatment time.  INSTRUCTIONS FOR 5-FLUOROURACIL/CALCIPOTRIENE CREAM:   5-fluorouracil/calcipotriene cream typically only needs to be used for 4-7 days. A thin layer should be applied twice a day to the treatment areas recommended by your physician.   If your physician prescribed you separate tubes of 5-fluourouracil and calcipotriene, apply a thin layer of 5-fluorouracil followed by a thin layer of calcipotriene.   Avoid contact with your eyes, nostrils, and mouth. Do not use 5-fluorouracil/calcipotriene cream on infected or open wounds.   You will develop redness, irritation and some crusting at areas where you have pre-cancer damage/actinic keratoses. IF YOU DEVELOP PAIN, BLEEDING, OR SIGNIFICANT CRUSTING, STOP THE TREATMENT EARLY - you have already gotten a good response and the actinic keratoses should clear up well.  Wash your hands after applying 5-fluorouracil 5% cream on your skin.   A moisturizer or sunscreen with a minimum SPF 30 should be applied each morning.   Once you have finished the treatment, you can apply a thin layer of Vaseline twice a day to irritated areas to soothe and calm the areas more quickly. If you experience significant discomfort, contact your physician.  For some patients it is necessary to repeat the treatment for best  results.  SIDE EFFECTS: When using 5-fluorouracil/calcipotriene cream, you may have mild irritation, such as redness, dryness, swelling, or a mild burning sensation. This usually resolves within 2 weeks. The more actinic keratoses  you have, the more redness and inflammation you can expect during treatment. Eye irritation has been reported rarely. If this occurs, please let us know.  If you have any trouble using this cream, please call the office. If you have any other questions about this information, please do not hesitate to ask me before you leave the office.       Biopsy Wound Care Instructions  Leave the original bandage on for 24 hours if possible.  If the bandage becomes soaked or soiled before that time, it is OK to remove it and examine the wound.  A small amount of post-operative bleeding is normal.  If excessive bleeding occurs, remove the bandage, place gauze over the site and apply continuous pressure (no peeking) over the area for 30 minutes. If this does not work, please call our clinic as soon as possible or page your doctor if it is after hours.   Once a day, cleanse the wound with soap and water. It is fine to shower. If a thick crust develops you may use a Q-tip dipped into dilute hydrogen peroxide (mix 1:1 with water) to dissolve it.  Hydrogen peroxide can slow the healing process, so use it only as needed.    After washing, apply petroleum jelly (Vaseline) or an antibiotic ointment if your doctor prescribed one for you, followed by a bandage.    For best healing, the wound should be covered with a layer of ointment at all times. If you are not able to keep the area covered with a bandage to hold the ointment in place, this may mean re-applying the ointment several times a day.  Continue this wound care until the wound has healed and is no longer open.   Itching and mild discomfort is normal during the healing process. However, if you develop pain or severe itching, please call our office.   If you have any discomfort, you can take Tylenol (acetaminophen) or ibuprofen as directed on the bottle. (Please do not take these if you have an allergy to them or cannot take them for another reason).  Some  redness, tenderness and white or yellow material in the wound is normal healing.  If the area becomes very sore and red, or develops a thick yellow-green material (pus), it may be infected; please notify us.    If you have stitches, return to clinic as directed to have the stitches removed. You will continue wound care for 2-3 days after the stitches are removed.   Wound healing continues for up to one year following surgery. It is not unusual to experience pain in the scar from time to time during the interval.  If the pain becomes severe or the scar thickens, you should notify the office.    A slight amount of redness in a scar is expected for the first six months.  After six months, the redness will fade and the scar will soften and fade.  The color difference becomes less noticeable with time.  If there are any problems, return for a post-op surgery check at your earliest convenience.  To improve the appearance of the scar, you can use silicone scar gel, cream, or sheets (such as Mederma or Serica) every night for up to one year. These are available over the counter (without  a prescription).  Please call our office at [phone] for any questions or concerns.           Actinic keratoses are precancerous spots that appear secondary to cumulative UV radiation exposure/sun exposure over time. They are chronic with expected duration over 1 year. A portion of actinic keratoses will progress to squamous cell carcinoma of the skin. It is not possible to reliably predict which spots will progress to skin cancer and so treatment is recommended to prevent development of skin cancer.  Recommend daily broad spectrum sunscreen SPF 30+ to sun-exposed areas, reapply every 2 hours as needed.  Recommend staying in the shade or wearing long sleeves, sun glasses (UVA+UVB protection) and wide brim hats (4-inch brim around the entire circumference of the hat). Call for new or changing lesions.    Cryotherapy Aftercare  Wash gently with soap and water everyday.   Apply Vaseline and Band-Aid daily until healed.        Melanoma ABCDEs  Melanoma is the most dangerous type of skin cancer, and is the leading cause of death from skin disease.  You are more likely to develop melanoma if you: Have light-colored skin, light-colored eyes, or red or blond hair Spend a lot of time in the sun Tan regularly, either outdoors or in a tanning bed Have had blistering sunburns, especially during childhood Have a close family member who has had a melanoma Have atypical moles or large birthmarks  Early detection of melanoma is key since treatment is typically straightforward and cure rates are extremely high if we catch it early.   The first sign of melanoma is often a change in a mole or a new dark spot.  The ABCDE system is a way of remembering the signs of melanoma.  A for asymmetry:  The two halves do not match. B for border:  The edges of the growth are irregular. C for color:  A mixture of colors are present instead of an even brown color. D for diameter:  Melanomas are usually (but not always) greater than 6mm - the size of a pencil eraser. E for evolution:  The spot keeps changing in size, shape, and color.  Please check your skin once per month between visits. You can use a small mirror in front and a large mirror behind you to keep an eye on the back side or your body.   If you see any new or changing lesions before your next follow-up, please call to schedule a visit.  Please continue daily skin protection including broad spectrum sunscreen SPF 30+ to sun-exposed areas, reapplying every 2 hours as needed when you're outdoors.   Staying in the shade or wearing long sleeves, sun glasses (UVA+UVB protection) and wide brim hats (4-inch brim around the entire circumference of the hat) are also recommended for sun protection.    Due to recent changes in healthcare laws, you may see  results of your pathology and/or laboratory studies on MyChart before the doctors have had a chance to review them. We understand that in some cases there may be results that are confusing or concerning to you. Please understand that not all results are received at the same time and often the doctors may need to interpret multiple results in order to provide you with the best plan of care or course of treatment. Therefore, we ask that you please give [place] 2 business days to thoroughly review all your results before contacting the office for clarification. Should we see  a critical lab result, you will be contacted sooner.   If You Need Anything After Your Visit  If you have any questions or concerns for your doctor, please call our main line at [phone] and press option 4 to reach your doctor's medical assistant. If no one answers, please leave a voicemail as directed and we will return your call as soon as possible. Messages left after 4 pm will be answered the following business day.   You may also send [place] a message via MyChart. We typically respond to MyChart messages within 1-2 business days.  For prescription refills, please ask your pharmacy to contact our office. Our fax number is [phone].  If you have an urgent issue when the clinic is closed that cannot wait until the next business day, you can page your doctor at the number below.    Please note that while we do our best to be available for urgent issues outside of office hours, we are not available 24/7.   If you have an urgent issue and are unable to reach [place], you may choose to seek medical care at your doctor's office, retail clinic, urgent care center, or emergency room.  If you have a medical emergency, please immediately call 911 or go to the emergency department.  Pager Numbers  - Dr. Gwen Pounds: [phone]  - Dr. Neale Burly: [phone]  - Dr. Roseanne Reno: [phone]  In the event of inclement weather, please call our main  line at [phone] for an update on the status of any delays or closures.  Dermatology Medication Tips: Please keep the boxes that topical medications come in in order to help keep track of the instructions about where and how to use these. Pharmacies typically print the medication instructions only on the boxes and not directly on the medication tubes.   If your medication is too expensive, please contact our office at [phone] option 4 or send [place] a message through MyChart.   We are unable to tell what your co-pay for medications will be in advance as this is different depending on your insurance coverage. However, we may be able to find a substitute medication at lower cost or fill out paperwork to get insurance to cover a needed medication.   If a prior authorization is required to get your medication covered by your insurance company, please allow [place] 1-2 business days to complete this process.  Drug prices often vary depending on where the prescription is filled and some pharmacies may offer cheaper prices.  The website www.goodrx.com contains coupons for medications through different pharmacies. The prices here do not account for what the cost may be with help from insurance (it may be cheaper with your insurance), but the website can give you the price if you did not use any insurance.  - You can print the associated coupon and take it with your prescription to the pharmacy.  - You may also stop by our office during regular business hours and pick up a GoodRx coupon card.  - If you need your prescription sent electronically to a different pharmacy, notify our office through [facility] or by phone at [phone] option 4.     Si Usted Necesita Algo Despus de Su Visita  Tambin puede enviarnos un mensaje a travs de Clinical cytogeneticist. Por lo general respondemos a los mensajes de MyChart en el transcurso de 1 a 2 das hbiles.  Para renovar recetas, por favor pida a su farmacia que  se ponga en contacto con  nuestra oficina. Annie Sable de fax es [place] [phone].  Si tiene un asunto urgente cuando la clnica est cerrada y que no puede esperar hasta el siguiente da hbil, puede llamar/localizar a su doctor(a) al nmero que aparece a continuacin.   Por favor, tenga en cuenta que aunque hacemos todo lo posible para estar disponibles para asuntos urgentes fuera del horario de [place], no estamos disponibles las 24 horas del da, los 7 [address]  [address] de la [place].   Si tiene un problema urgente y no puede comunicarse con nosotros, puede optar por buscar atencin mdica  en el consultorio de su doctor(a), en una clnica privada, en un centro de atencin urgente o en una sala de emergencias.  Si tiene Engineer, drilling, por favor llame inmediatamente al 911 o vaya a la sala de emergencias.  Nmeros de bper  - Dr. Gwen Pounds: [phone]  - Dra. Moye: [phone]  - Dra. Roseanne Reno: [phone]  En caso de inclemencias del [place], por favor llame a Lacy Duverney principal al [phone] para una actualizacin sobre el [place] de cualquier retraso o cierre.  Consejos para la medicacin en dermatologa: Por favor, guarde las cajas en las que vienen los medicamentos de uso tpico para ayudarle a seguir las instrucciones sobre dnde y cmo usarlos. Las farmacias generalmente imprimen las instrucciones del medicamento slo en las cajas y no directamente en los tubos del [place].   Si su medicamento es muy caro, por favor, pngase en contacto con Rolm [place]ala llamando al [phone] y presione la opcin 4 o envenos un mensaje a travs de Clinical cytogeneticist.   No podemos decirle cul ser su copago por los medicamentos por adelantado ya que esto es diferente dependiendo de la cobertura de su seguro. Sin embargo, es posible que podamos encontrar un medicamento sustituto a Audiological scientist un formulario para que el seguro cubra el medicamento que se considera necesario.   Si se  requiere una autorizacin previa para que su compaa de seguros [place] su medicamento, por favor permtanos de 1 a 2 das hbiles para completar [address]  Los precios de los medicamentos varan con frecuencia dependiendo del Environmental consultant de dnde se surte la receta y alguna farmacias pueden ofrecer precios ms baratos.  El sitio web www.goodrx.com tiene cupones para medicamentos de Health and safety inspector. Los precios aqu no tienen en cuenta lo que podra costar con la ayuda del seguro (puede ser ms barato con su seguro), pero el sitio web puede darle el precio si no utiliz Tourist information centre manager.  - Puede imprimir el cupn correspondiente y llevarlo con su receta a la farmacia.  - Tambin puede pasar por nuestra oficina durante el horario de atencin regular y Education officer, museum una tarjeta de cupones de GoodRx.  - Si necesita que su receta se enve electrnicamente a una farmacia diferente, informe a nuestra oficina a travs de MyChart de [place] o por telfono llamando al [phone] y presione la opcin 4.

## 2023-03-21 NOTE — Progress Notes (Signed)
Follow-Up Visit   Subjective  Ronnie Buckley is a 72 y.o. male who presents for the following: Skin Cancer Screening and Full Body Skin Exam Hx of isks hx of aks  Left posterior waistline  0.7 cm irregular brown macule  Irregular nevus r/o dysplasia  16 scalp ear x 2 10 days to top of scalp for twice daily start in a month  5 fu  The patient presents for Total-Body Skin Exam (TBSE) for skin cancer screening and mole check. The patient has spots, moles and lesions to be evaluated, some may be new or changing and the patient has concerns that these could be cancer.  The following portions of the chart were reviewed this encounter and updated as appropriate: medications, allergies, medical history  Review of Systems:  No other skin or systemic complaints except as noted in HPI or Assessment and Plan.  Objective  Well appearing patient in no apparent distress; mood and affect are within normal limits.  A full examination was performed including scalp, head, eyes, ears, nose, lips, neck, chest, axillae, abdomen, back, buttocks, bilateral upper extremities, bilateral lower extremities, hands, feet, fingers, toes, fingernails, and toenails. All findings within normal limits unless otherwise noted below.   Relevant physical exam findings are noted in the Assessment and Plan.  scalp face and ears (16) Erythematous thin papules/macules with gritty scale.   left posterior waistline 0.7 cm irregular brown macule         Assessment & Plan   LENTIGINES, SEBORRHEIC KERATOSES, HEMANGIOMAS - Benign normal skin lesions - Benign-appearing - Call for any changes  MELANOCYTIC NEVI - Tan-brown and/or pink-flesh-colored symmetric macules and papules - Benign appearing on exam today - Observation - Call clinic for new or changing moles - Recommend daily use of broad spectrum spf 30+ sunscreen to sun-exposed areas.   Purpura - Chronic; persistent and recurrent.  Treatable, but not  curable. - Violaceous macules and patches - Benign - Related to trauma, age, sun damage and/or use of blood thinners, chronic use of topical and/or oral steroids - Observe - Can use OTC arnica containing moisturizer such as Dermend Bruise Formula if desired - Call for worsening or other concerns  STASIS DERMATITIS Exam: Erythematous, scaly patches involving the ankle and distal lower leg with associated lower leg edema. Stasis in the legs causes chronic leg swelling, which may result in itchy or painful rashes, skin discoloration, skin texture changes, and sometimes ulceration.  Recommend daily graduated compression hose/stockings- easiest to put on first thing in morning, remove at bedtime.  Elevate legs as much as possible. Avoid salt/sodium rich foods. Treatment Plan: As above.  Reassured.  ACTINIC DAMAGE - Chronic condition, secondary to cumulative UV/sun exposure - diffuse scaly erythematous macules with underlying dyspigmentation - Recommend daily broad spectrum sunscreen SPF 30+ to sun-exposed areas, reapply every 2 hours as needed.  - Staying in the shade or wearing long sleeves, sun glasses (UVA+UVB protection) and wide brim hats (4-inch brim around the entire circumference of the hat) are also recommended for sun protection.  - Call for new or changing lesions.  SKIN CANCER SCREENING PERFORMED TODAY.  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 They are chronic with expected duration over 1 year. A portion of actinic keratoses will progress to squamous cell carcinoma of the skin. It is not possible to reliably predict which spots will progress to skin cancer and  so treatment is recommended to prevent development of skin cancer.  Recommend daily broad spectrum sunscreen SPF 30+ to sun-exposed areas, reapply every 2 hours as needed.  Recommend staying in the  shade or wearing long sleeves, sun glasses (UVA+UVB protection) and wide brim hats (4-inch brim around the entire circumference of the hat). Call for new or changing lesions.  Destruction of lesion - scalp face and ears Complexity: simple   Destruction method: cryotherapy   Informed consent: discussed and consent obtained   Timeout:  patient name, date of birth, surgical site, and procedure verified Lesion destroyed using liquid nitrogen: Yes   Region frozen until ice ball extended beyond lesion: Yes   Outcome: patient tolerated procedure well with no complications   Post-procedure details: wound care instructions given    ACTINIC DAMAGE WITH PRECANCEROUS ACTINIC KERATOSES Counseling for Topical Chemotherapy Management: Patient exhibits: - Severe, confluent actinic changes with pre-cancerous actinic keratoses that is secondary to cumulative UV radiation exposure over time - Condition that is severe; chronic, not at goal. - diffuse scaly erythematous macules and papules with underlying dyspigmentation - Discussed Prescription "Field Treatment" topical Chemotherapy for Severe, Chronic Confluent Actinic Changes with Pre-Cancerous Actinic Keratoses Field treatment involves treatment of an entire area of skin that has confluent Actinic Changes (Sun/ Ultraviolet light damage) and PreCancerous Actinic Keratoses by method of PhotoDynamic Therapy (PDT) and/or prescription Topical Chemotherapy agents such as 5-fluorouracil, 5-fluorouracil/calcipotriene, and/or imiquimod.  The purpose is to decrease the number of clinically evident and subclinical PreCancerous lesions to prevent progression to development of skin cancer by chemically destroying early precancer changes that may or may not be visible.  It has been shown to reduce the risk of developing skin cancer in the treated area. As a result of treatment, redness, scaling, crusting, and open sores may occur during treatment course. One or more than one  of these methods may be used and may have to be used several times to control, suppress and eliminate the PreCancerous changes. Discussed treatment course, expected reaction, and possible side effects. - Recommend daily broad spectrum sunscreen SPF 30+ to sun-exposed areas, reapply every 2 hours as needed.  - Staying in the shade or wearing long sleeves, sun glasses (UVA+UVB protection) and wide brim hats (4-inch brim around the entire circumference of the hat) are also recommended. - Call for new or changing lesions. Start 5-FU/calcipotriene mix twice a day for 10 days starting in 1 month on the scalp  Neoplasm of uncertain behavior left posterior waistline  Epidermal / dermal shaving  Lesion diameter (cm):  0.7 Informed consent: discussed and consent obtained   Timeout: patient name, date of birth, surgical site, and procedure verified   Procedure prep:  Patient was prepped and draped in usual sterile fashion Prep type:  Isopropyl alcohol Anesthesia: the lesion was anesthetized in a standard fashion   Anesthetic:  1% lidocaine w/ epinephrine 1-100,000 buffered w/ 8.4% NaHCO3 Instrument used: flexible razor blade   Hemostasis achieved with: pressure, aluminum chloride and electrodesiccation   Outcome: patient tolerated procedure well   Post-procedure details: sterile dressing applied and wound care instructions given   Dressing type: bandage and petrolatum    Specimen 1 - Surgical pathology Differential Diagnosis: irregular nevus r/o dysplasia   Check Margins: No  Irregular nevus r/o dysplasia    Return for 5 month ak follow up on scalp, 1 year tbse .  Geralynn Rile, CMA, am acting as scribe for Armida Sans, MD.  Documentation: I have reviewed the  above documentation for accuracy and completeness, and I agree with the above.  Armida Sans, MD

## 2023-03-23 ENCOUNTER — Encounter: Payer: Self-pay | Admitting: Dermatology

## 2023-03-26 ENCOUNTER — Telehealth: Payer: Self-pay

## 2023-03-26 NOTE — Telephone Encounter (Signed)
-----   Message from Deirdre Evener, MD sent at 03/23/2023  3:38 PM EDT ----- Diagnosis Skin , left posterior waistline DYSPLASTIC COMPOUND NEVUS WITH MODERATE ATYPIA  Moderate dysplastic Recheck next visit

## 2023-03-26 NOTE — Telephone Encounter (Signed)
Advised pt of bx resutls/sh 

## 2023-04-11 ENCOUNTER — Inpatient Hospital Stay: Payer: Medicare HMO

## 2023-04-11 ENCOUNTER — Inpatient Hospital Stay: Payer: Medicare HMO | Attending: Oncology | Admitting: Oncology

## 2023-04-11 ENCOUNTER — Encounter: Payer: Self-pay | Admitting: Oncology

## 2023-04-11 VITALS — BP 118/67 | HR 73 | Temp 97.5°F | Ht 70.0 in | Wt 190.2 lb

## 2023-04-11 DIAGNOSIS — D509 Iron deficiency anemia, unspecified: Secondary | ICD-10-CM | POA: Diagnosis not present

## 2023-04-11 DIAGNOSIS — Z8589 Personal history of malignant neoplasm of other organs and systems: Secondary | ICD-10-CM

## 2023-04-11 DIAGNOSIS — Z87891 Personal history of nicotine dependence: Secondary | ICD-10-CM | POA: Diagnosis not present

## 2023-04-11 DIAGNOSIS — Z923 Personal history of irradiation: Secondary | ICD-10-CM | POA: Diagnosis not present

## 2023-04-11 DIAGNOSIS — C029 Malignant neoplasm of tongue, unspecified: Secondary | ICD-10-CM

## 2023-04-11 DIAGNOSIS — Z9221 Personal history of antineoplastic chemotherapy: Secondary | ICD-10-CM | POA: Diagnosis not present

## 2023-04-11 DIAGNOSIS — Z85810 Personal history of malignant neoplasm of tongue: Secondary | ICD-10-CM | POA: Diagnosis present

## 2023-04-11 DIAGNOSIS — Z08 Encounter for follow-up examination after completed treatment for malignant neoplasm: Secondary | ICD-10-CM

## 2023-04-11 DIAGNOSIS — Z79899 Other long term (current) drug therapy: Secondary | ICD-10-CM | POA: Diagnosis not present

## 2023-04-11 LAB — CBC WITH DIFFERENTIAL/PLATELET
Abs Immature Granulocytes: 0.01 10*3/uL (ref 0.00–0.07)
Basophils Absolute: 0 10*3/uL (ref 0.0–0.1)
Basophils Relative: 1 %
Eosinophils Absolute: 0.2 10*3/uL (ref 0.0–0.5)
Eosinophils Relative: 4 %
HCT: 35.4 % — ABNORMAL LOW (ref 39.0–52.0)
Hemoglobin: 11.8 g/dL — ABNORMAL LOW (ref 13.0–17.0)
Immature Granulocytes: 0 %
Lymphocytes Relative: 21 %
Lymphs Abs: 1 10*3/uL (ref 0.7–4.0)
MCH: 32.8 pg (ref 26.0–34.0)
MCHC: 33.3 g/dL (ref 30.0–36.0)
MCV: 98.3 fL (ref 80.0–100.0)
Monocytes Absolute: 0.4 10*3/uL (ref 0.1–1.0)
Monocytes Relative: 9 %
Neutro Abs: 3.1 10*3/uL (ref 1.7–7.7)
Neutrophils Relative %: 65 %
Platelets: 171 10*3/uL (ref 150–400)
RBC: 3.6 MIL/uL — ABNORMAL LOW (ref 4.22–5.81)
RDW: 14 % (ref 11.5–15.5)
WBC: 4.7 10*3/uL (ref 4.0–10.5)
nRBC: 0 % (ref 0.0–0.2)

## 2023-04-11 LAB — COMPREHENSIVE METABOLIC PANEL
ALT: 18 U/L (ref 0–44)
AST: 34 U/L (ref 15–41)
Albumin: 3.7 g/dL (ref 3.5–5.0)
Alkaline Phosphatase: 52 U/L (ref 38–126)
Anion gap: 12 (ref 5–15)
BUN: 49 mg/dL — ABNORMAL HIGH (ref 8–23)
CO2: 27 mmol/L (ref 22–32)
Calcium: 9.6 mg/dL (ref 8.9–10.3)
Chloride: 102 mmol/L (ref 98–111)
Creatinine, Ser: 0.93 mg/dL (ref 0.61–1.24)
GFR, Estimated: >60 mL/min (ref >60)
Glucose, Bld: 127 mg/dL — ABNORMAL HIGH (ref 70–99)
Potassium: 4.7 mmol/L (ref 3.5–5.1)
Sodium: 141 mmol/L (ref 135–145)
Total Bilirubin: 0.4 mg/dL (ref 0.3–1.2)
Total Protein: 6.9 g/dL (ref 6.5–8.1)

## 2023-04-11 LAB — IRON AND TIBC
Iron: 85 ug/dL (ref 45–182)
Saturation Ratios: 24 % (ref 17.9–39.5)
TIBC: 350 ug/dL (ref 250–450)
UIBC: 265 ug/dL

## 2023-04-11 LAB — TSH: TSH: 2.06 u[IU]/mL (ref 0.350–4.500)

## 2023-04-11 LAB — FERRITIN: Ferritin: 26 ng/mL (ref 24–336)

## 2023-04-11 NOTE — Progress Notes (Signed)
Nutrition Follow-up:   Patient with stage IV tongue cancer followed by Dr Smith Robert.  S/p total glossectomy with bilateral neck dissection on 07/15/20 at [facility].  PEG placed on 09/22/20 at [facility].  Patient completed chemotherapy on 11/30/20 and radiation on 12/03/20  Met with patient following MD visit.  Patient has been able to switch back to glucerna 1.5 (back in stock from [place]).  Using 7 cartons per day (3 cartons at breakfast, 2 cartons at lunch and 2 cartons at dinner).  Gives prostat 30ml at breakfast feeding only.  Gives 240-341ml free water at each feeding.  Sips on water orally during the day especially in hot weather.  Sometimes drinks broth, juice tea orally as well.  Overall feels well.  Reports constipation is better on glucerna.  Has bowel movement every other day usually.    Medications: MVI, adding liquid Fe, Vit C 1000mg , metformin, glipizide  Labs: glucose 127, BUN 49, creatinine 0.93, hgb 11.8  Anthropometrics:   Weight 190 lb 3.2 oz today 191 lb on 1/3 183 lb on 7/13 179 lb 1 oz on 12/29/21 181 lb on 09/21/21   Estimated Energy Needs  Kcals: 2150-2580 Protein: 108-129 g Fluid: 2150-2580 ml  NUTRITION DIAGNOSIS: Inadequate oral intake, relying on feeding tube for nutrition   INTERVENTION:  Continue glucerna 1.5, 7 cartons per day (3 at breakfast, 2 at lunch and 2 at dinner). Flush with 240-367ml free water at each feeding (TID). Stop prostat 30ml daily at this time.  If has to go back to [employer]et[employer] will need to restart prostat. Provides 2492 calories, 137 g protein and 1980-2160 ml free water + oral intake Patient received supplies from [place].   Patient planning to start liquid iron via feeding tube Patient has contact number    MONITORING, EVALUATION, GOAL: weight trends, intake   NEXT VISIT: Wednesday, Jan 15 after MD visit  Miner Koral B. Freida Busman, RD, LDN Registered Dietitian [phone]

## 2023-04-11 NOTE — Progress Notes (Signed)
C/o low iron and questions about infusion for this.

## 2023-04-15 NOTE — Progress Notes (Signed)
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 Lendell Caprice for a lesion that he identified on his left tongue.  Biopsy on 06/15/2020 was consistent with squamous cell carcinoma.  This was followed by a CT neck and chest.  CT neck showed a large poorly defined infiltrative mass throughout the posterior left oral tongue and left oral base extending to the left glossotonsillar sulcus.  The mass crosses midline through the lingual septum into the right hemitongue.  Findings are concerning for left lateral retropharyngeal as well as level 1 and 4 nodal metastases.  CT chest without contrast did not show any evidence of metastatic disease.  Patient then underwent total glossectomy with bilateral neck dissection on 07/15/2020.   Pathology showed:Surgical Pathology Final Pathologic Diagnosis  A. LYMPH NODE, LEFT LEVEL 1B, DISSECTION: Benign salivary gland tissue. One out of six lymph nodes, positive for metastatic carcinoma (1/6).   B. LYMPH NODES, LEFT LEVEL 2A, 3, & 4, DISSECTION: Sixteen lymph nodes, negative for metastatic carcinoma (0/16).   C. LYMPH NODES, LEFT LEVEL 2B, DISSECTION: Seven lymph nodes, negative for metastatic carcinoma (0/7).   D. LYMPH NODES, LEFT LEVEL 1A, DISSECTION: One lymph node, negative for metastatic carcinoma (0/1).   E. LYMPH NODES, RIGHT  LEVEL 2A, 3, &4, DISSECTION: Eleven lymph nodes, negative for metastatic carcinoma (0/11).   F. LYMPH NODES, RIGHT LEVEL 2A AND 2B, DISSECTION: Eleven lymph nodes, negative for metastatic carcinoma(0/11).   G. LINGUAL NERVE, RIGHT, EXCISION: Benign nerve tissue. No malignancy identified.   H. LYMPH NODES, RIGHT LEVEL 1B, DISSECTION: Benign salivary gland tissue. Three lymph nodes, negative for metastatic carcinoma. (0/3).   I. LINGUAL NERVE, PROXIMAL LEFT MARGIN, EXCISION: Benign peripheral nerve. No malignancy identified.   J. FLOOR OF MOUTH, LEFT MARGIN, EXCISION: Benign oropharyngeal mucosa. No malignancy identified.   K. SOFT PALATE, LEFT MARGIN, EXCISION: Benign oropharyngeal mucosa. No malignancy identified.   L. PHARYNGEAL MUCOSAL MARGIN, LEFT, EXCISION: Benign oropharyngeal mucosa. No malignancy identified.   M. VALLECULA MUCOSA MARGIN, EXCISION: Benign oropharyngeal mucosa. No malignancy identified.   N. PTERYGOID, DEEP TONSILLAR FOSSA MARGIN, EXCISION: Benign soft tissue. No malignancy identified.   O. TONGUE, TOTAL GLOSSECTOMY: Invasive moderately differentiated squamous cell carcinoma, keratinizing (4.6 cm). Tumor focally present at the deep and left tissue edges, please see separated margin status.  Lymphovascular invasion identified.  Perineural invasion is identified. Pathologic stage: pT4a pN1.  See CAP synoptic report.     Patient was evaluated by radiation oncology at [facility] and plan was to offer adjuvant concurrent chemoradiation.  However patient lives in [place] and prefers to get care locally.   He also underwent tracheostomy removal, port placement and PEG tube placement at [facility].  Patient completed concurrent chemoradiation with weekly cisplatin in March 2022.    Interval history- he is doing well. Speech is improving. Denies any neck swelling. He still uses PEG tube for nutrition  ECOG PS- 1  Pain scale- 0  Review of systems-  Review of Systems  Constitutional:  Negative for chills, fever, malaise/fatigue and weight loss.  HENT:  Negative for congestion, ear discharge and nosebleeds.   Eyes:  Negative for blurred vision.  Respiratory:  Negative for cough, hemoptysis, sputum production, shortness of breath and wheezing.   Cardiovascular:  Negative for chest pain, palpitations, orthopnea and claudication.  Gastrointestinal:  Negative for abdominal pain, blood in stool, constipation, diarrhea, heartburn, melena, nausea and vomiting.  Genitourinary:  Negative for dysuria, flank pain, frequency, hematuria and urgency.  Musculoskeletal:  Negative for back pain, joint pain and myalgias.  Skin:  Negative for rash.  Neurological:  Negative for dizziness, tingling, focal weakness, seizures, weakness and headaches.  Endo/Heme/Allergies:  Does not bruise/bleed easily.  Psychiatric/Behavioral:  Negative for depression and suicidal ideas. The patient does not have insomnia.       Allergies  Allergen Reactions   Tape     Redness and bleeding with some tapes and adhesives.     Past Medical History:  Diagnosis Date   Actinic keratosis 03/03/2010   left dorsum hand, bx proven   Atypical mole 03/21/2023   left posterior waistline, moderate atypia     History reviewed. No pertinent surgical history.  Social History   Socioeconomic History   Marital status: Widowed    Spouse name: Not on file   Number of children: Not on file   Years of education: Not on file   Highest education level: Not on file  Occupational History   Not on file  Tobacco Use   Smoking status: Former    Types: Cigarettes   Smokeless tobacco: Never   Tobacco comments:    60 years ago  Vaping Use   Vaping status: Never Used  Substance and Sexual Activity   Alcohol use: Not Currently   Drug use: Never   Sexual activity: Not Currently  Other Topics Concern   Not on file  Social History Narrative   Not on file   Social Determinants of  Health   Financial Resource Strain: Low Risk  (01/17/2023)   Received from [facility], [facility]   Overall Financial Resource Strain (CARDIA)    Difficulty of Paying Living Expenses: Not hard at all  Food Insecurity: No Food Insecurity (01/17/2023)   Received from [facility], [facility]   Hunger Vital Sign    Worried About Running Out of Food in the Last Year: Never true    Ran Out of Food in the Last Year: Never true  Transportation Needs: No Transportation Needs (01/17/2023)   Received from [facility], [facility]   [facility] - Transportation    In the past 12 months, has lack of transportation kept you from medical appointments or from getting medications?: No    Lack of Transportation (Non-Medical): No  Physical Activity: Not on file  Stress: Not on file  Social Connections: Not on file  Intimate Partner Violence: Not on file    History reviewed. No pertinent family history.   Current Outpatient Medications:    Ascorbic Acid (VITAMIN C) 1000 MG tablet, Take 1,000 mg by mouth daily., Disp: , Rfl:    DENTAGEL 1.1 % GEL dental gel, PLEASE SEE ATTACHED FOR DETAILED DIRECTIONS, Disp: , Rfl:    enalapril (VASOTEC) 5 MG tablet, Take 5 mg by mouth daily., Disp: , Rfl:    glipiZIDE (GLUCOTROL) 10 MG tablet, Take 10  mg by mouth daily before breakfast., Disp: , Rfl:    LORazepam (ATIVAN) 0.5 MG tablet, Take 1 tablet (0.5 mg total) by mouth every 8 (eight) hours as needed for anxiety., Disp: 30 tablet, Rfl: 0   metFORMIN (GLUCOPHAGE) 500 MG tablet, Take 1,000 mg by mouth daily. Take two once daily, Disp: , Rfl:    Multiple Vitamin (MULTI-VITAMIN) tablet, Take 1 tablet by mouth daily., Disp: , Rfl:    pravastatin (PRAVACHOL) 40 MG tablet, Take 1 tablet by mouth daily., Disp: , Rfl:   Physical exam:  Vitals:   04/11/23 1251  BP: 118/67  Pulse: 73  Temp: (!) 97.5 F (36.4 C)   TempSrc: Tympanic  SpO2: 98%  Weight: 190 lb 3.2 oz (86.3 kg)  Height: 5\' 10"  (1.778 m)   Physical Exam HENT:     Mouth/Throat:     Comments: Patient is s/ total glossectomy Cardiovascular:     Rate and Rhythm: Normal rate and regular rhythm.     Heart sounds: Normal heart sounds.  Pulmonary:     Effort: Pulmonary effort is normal.     Breath sounds: Normal breath sounds.  Abdominal:     General: Bowel sounds are normal.     Palpations: Abdomen is soft.  Lymphadenopathy:     Comments: No palpable cervical adenopathy  Skin:    General: Skin is warm and dry.  Neurological:     Mental Status: He is alert and oriented to person, place, and time.         Latest Ref Rng & Units 04/11/2023   12:36 PM  CMP  Glucose 70 - 99 mg/dL 865   BUN 8 - 23 mg/dL 49   Creatinine 7.84 - 1.24 mg/dL 6.96   Sodium 295 - 284 mmol/L 141   Potassium 3.5 - 5.1 mmol/L 4.7   Chloride 98 - 111 mmol/L 102   CO2 22 - 32 mmol/L 27   Calcium 8.9 - 10.3 mg/dL 9.6   Total Protein 6.5 - 8.1 g/dL 6.9   Total Bilirubin 0.3 - 1.2 mg/dL 0.4   Alkaline Phos 38 - 126 U/L 52   AST 15 - 41 U/L 34   ALT 0 - 44 U/L 18       Latest Ref Rng & Units 04/11/2023   12:36 PM  CBC  WBC 4.0 - 10.5 K/uL 4.7   Hemoglobin 13.0 - 17.0 g/dL 13.2   Hematocrit 44.0 - 52.0 % 35.4   Platelets 150 - 400 K/uL 171      Assessment and plan- Patient is a 72 y.o. male for routine follow-up of following issues:  With regards to head and neck cancer he is doing well with no concerning signs and symptoms of recurrence based on today's exam.  He is continuing to follow-up with Dr. Lynnea Ferrier as well every 6 months.  Iron deficiency anemia: He is mildly anemic and his recent labs indicated iron deficiency.  We discussed trying liquid iron through his PEG tube versus IV iron.  Patient would like to try liquid iron at this time.  I will repeat CBC ferritin and iron studies in 3 months and see him in 6 months   Visit Diagnosis 1.  Encounter for follow-up surveillance of head and neck cancer   2. Iron deficiency anemia, unspecified iron deficiency anemia type      Dr. Owens Shark, MD, MPH [facility] [phone] 04/15/2023 5:03 PM

## 2023-08-22 ENCOUNTER — Ambulatory Visit: Payer: Medicare HMO | Admitting: Dermatology

## 2023-08-22 ENCOUNTER — Encounter: Payer: Self-pay | Admitting: Dermatology

## 2023-08-22 DIAGNOSIS — L570 Actinic keratosis: Secondary | ICD-10-CM

## 2023-08-22 DIAGNOSIS — C44310 Basal cell carcinoma of skin of unspecified parts of face: Secondary | ICD-10-CM | POA: Diagnosis not present

## 2023-08-22 DIAGNOSIS — W908XXA Exposure to other nonionizing radiation, initial encounter: Secondary | ICD-10-CM | POA: Diagnosis not present

## 2023-08-22 DIAGNOSIS — L578 Other skin changes due to chronic exposure to nonionizing radiation: Secondary | ICD-10-CM

## 2023-08-22 DIAGNOSIS — C4491 Basal cell carcinoma of skin, unspecified: Secondary | ICD-10-CM

## 2023-08-22 DIAGNOSIS — D492 Neoplasm of unspecified behavior of bone, soft tissue, and skin: Secondary | ICD-10-CM | POA: Diagnosis not present

## 2023-08-22 HISTORY — DX: Basal cell carcinoma of skin, unspecified: C44.91

## 2023-08-22 NOTE — Patient Instructions (Addendum)
Cryotherapy Aftercare  Wash gently with soap and water everyday.   Apply Vaseline Jelly daily until healed.    Wound Care Instructions  Cleanse wound gently with soap and water once a day then pat dry with clean gauze. Apply a thin coat of Petrolatum (petroleum jelly, "Vaseline") over the wound (unless you have an allergy to this). We recommend that you use a new, sterile tube of Vaseline. Do not pick or remove scabs. Do not remove the yellow or white "healing tissue" from the base of the wound.  Cover the wound with fresh, clean, nonstick gauze and secure with paper tape. You may use Band-Aids in place of gauze and tape if the wound is small enough, but would recommend trimming much of the tape off as there is often too much. Sometimes Band-Aids can irritate the skin.  You should call the office for your biopsy report after 1 week if you have not already been contacted.  If you experience any problems, such as abnormal amounts of bleeding, swelling, significant bruising, significant pain, or evidence of infection, please call the office immediately.  FOR ADULT SURGERY PATIENTS: If you need something for pain relief you may take 1 extra strength Tylenol (acetaminophen) AND 2 Ibuprofen (200mg  each) together every 4 hours as needed for pain. (do not take these if you are allergic to them or if you have a reason you should not take them.) Typically, you may only need pain medication for 1 to 3 days.      Recommend daily broad spectrum sunscreen SPF 30+ to sun-exposed areas, reapply every 2 hours as needed. Call for new or changing lesions.  Staying in the shade or wearing long sleeves, sun glasses (UVA+UVB protection) and wide brim hats (4-inch brim around the entire circumference of the hat) are also recommended for sun protection.    Due to recent changes in healthcare laws, you may see results of your pathology and/or laboratory studies on MyChart before the doctors have had a chance to  review them. We understand that in some cases there may be results that are confusing or concerning to you. Please understand that not all results are received at the same time and often the doctors may need to interpret multiple results in order to provide you with the best plan of care or course of treatment. Therefore, we ask that you please give [place] 2 business days to thoroughly review all your results before contacting the office for clarification. Should we see a critical lab result, you will be contacted sooner.   If You Need Anything After Your Visit  If you have any questions or concerns for your doctor, please call our main line at [phone] and press option 4 to reach your doctor's medical assistant. If no one answers, please leave a voicemail as directed and we will return your call as soon as possible. Messages left after 4 pm will be answered the following business day.   You may also send [place] a message via MyChart. We typically respond to MyChart messages within 1-2 business days.  For prescription refills, please ask your pharmacy to contact our office. Our fax number is [phone].  If you have an urgent issue when the clinic is closed that cannot wait until the next business day, you can page your doctor at the number below.    Please note that while we do our best to be available for urgent issues outside of office hours, we are not available 24/7.  If you have an urgent issue and are unable to reach [place], you may choose to seek medical care at your doctor's office, retail clinic, urgent care center, or emergency room.  If you have a medical emergency, please immediately call 911 or go to the emergency department.  Pager Numbers  - Dr. Gwen Pounds: [phone]  - Dr. Roseanne Reno: [phone]  - Dr. Katrinka Blazing: [phone]   In the event of inclement weather, please call our main line at [phone] for an update on the status of any delays or closures.  Dermatology  Medication Tips: Please keep the boxes that topical medications come in in order to help keep track of the instructions about where and how to use these. Pharmacies typically print the medication instructions only on the boxes and not directly on the medication tubes.   If your medication is too expensive, please contact our office at [phone] option 4 or send [place] a message through MyChart.   We are unable to tell what your co-pay for medications will be in advance as this is different depending on your insurance coverage. However, we may be able to find a substitute medication at lower cost or fill out paperwork to get insurance to cover a needed medication.   If a prior authorization is required to get your medication covered by your insurance company, please allow [place] 1-2 business days to complete this process.  Drug prices often vary depending on where the prescription is filled and some pharmacies may offer cheaper prices.  The website www.goodrx.com contains coupons for medications through different pharmacies. The prices here do not account for what the cost may be with help from insurance (it may be cheaper with your insurance), but the website can give you the price if you did not use any insurance.  - You can print the associated coupon and take it with your prescription to the pharmacy.  - You may also stop by our office during regular business hours and pick up a GoodRx coupon card.  - If you need your prescription sent electronically to a different pharmacy, notify our office through [facility] or by phone at [phone] option 4.     Si Usted Necesita Algo Despus de Su Visita  Tambin puede enviarnos un mensaje a travs de Clinical cytogeneticist. Por lo general respondemos a los mensajes de MyChart en el transcurso de 1 a 2 das hbiles.  Para renovar recetas, por favor pida a su farmacia que se ponga en contacto con nuestra oficina. Annie Sable de fax es [place] [phone].  Si  tiene un asunto urgente cuando la clnica est cerrada y que no puede esperar hasta el siguiente da hbil, puede llamar/localizar a su doctor(a) al nmero que aparece a continuacin.   Por favor, tenga en cuenta que aunque hacemos todo lo posible para estar disponibles para asuntos urgentes fuera del horario de [place], no estamos disponibles las 24 horas del da, los 7 [address]  [address] de la [place].   Si tiene un problema urgente y no puede comunicarse con nosotros, puede optar por buscar atencin mdica  en el consultorio de su doctor(a), en una clnica privada, en un centro de atencin urgente o en una sala de emergencias.  Si tiene Engineer, drilling, por favor llame inmediatamente al 911 o vaya a la sala de emergencias.  Nmeros de bper  - Dr. Gwen Pounds: [phone]  - Dra. Roseanne Reno: [phone]  - Dr. Katrinka Blazing: [phone]   En caso de inclemencias del tiempo, por favor llame a Ferne Coe  lnea principal al [phone] para una actualizacin sobre el [place] de cualquier retraso o cierre.  Consejos para la medicacin en dermatologa: Por favor, guarde las cajas en las que vienen los medicamentos de uso tpico para ayudarle a seguir las instrucciones sobre dnde y cmo usarlos. Las farmacias generalmente imprimen las instrucciones del medicamento slo en las cajas y no directamente en los tubos del [place].   Si su medicamento es muy caro, por favor, pngase en contacto con Rolm Gala llamando al [phone] y presione la opcin 4 o envenos un mensaje a travs de Clinical cytogeneticist.   No podemos decirle cul ser su copago por los medicamentos por adelantado ya que esto es diferente dependiendo de la cobertura de su seguro. Sin embargo, es posible que podamos encontrar un medicamento sustituto a Audiological scientist un formulario para que el seguro cubra el medicamento que se considera necesario.   Si se requiere una autorizacin previa para que su compaa de seguros [place] su medicamento, por  favor permtanos de 1 a 2 das hbiles para completar [address]  Los precios de los medicamentos varan con frecuencia dependiendo del Environmental consultant de dnde se surte la receta y alguna farmacias pueden ofrecer precios ms baratos.  El sitio web www.goodrx.com tiene cupones para medicamentos de Health and safety inspector. Los precios aqu no tienen en cuenta lo que podra costar con la ayuda del seguro (puede ser ms barato con su seguro), pero el sitio web puede darle el precio si no utiliz Tourist information centre manager.  - Puede imprimir el cupn correspondiente y llevarlo con su receta a la farmacia.  - Tambin puede pasar por nuestra oficina durante el horario de atencin regular y Education officer, museum una tarjeta de cupones de GoodRx.  - Si necesita que su receta se enve electrnicamente a una farmacia diferente, informe a nuestra oficina a travs de MyChart de [place] o por telfono llamando al [phone] y presione la opcin 4.

## 2023-08-22 NOTE — Progress Notes (Signed)
Follow-Up Visit   Subjective  Ronnie Buckley is a 72 y.o. male who presents for the following: 5 month AK follow up. Scalp, face. Used 5FU/Calcipotriene on scalp since last visit.   Check spot in front of left ear. States was there at the last visit, 03/21/2023. Non tender. Has bled. States it will not heal.   The patient has spots, moles and lesions to be evaluated, some may be new or changing and the patient may have concern these could be cancer.   The following portions of the chart were reviewed this encounter and updated as appropriate: medications, allergies, medical history  Review of Systems:  No other skin or systemic complaints except as noted in HPI or Assessment and Plan.  Objective  Well appearing patient in no apparent distress; mood and affect are within normal limits.  A focused examination was performed of the following areas: Scalp, face, ears, neck  Relevant exam findings are noted in the Assessment and Plan.  Left Preauricular Area 1.2 cm erythematous hemorrhagic plaque       Scalp and face, left ear x20 (20) Erythematous thin papules/macules with gritty scale.     Assessment & Plan   ACTINIC DAMAGE - chronic, secondary to cumulative UV radiation exposure/sun exposure over time - diffuse scaly erythematous macules with underlying dyspigmentation - Recommend daily broad spectrum sunscreen SPF 30+ to sun-exposed areas, reapply every 2 hours as needed.  - Recommend staying in the shade or wearing long sleeves, sun glasses (UVA+UVB protection) and wide brim hats (4-inch brim around the entire circumference of the hat). - Call for new or changing lesions.   Neoplasm of skin Left Preauricular Area  Epidermal / dermal shaving  Lesion diameter (cm):  1.2 Informed consent: discussed and consent obtained   Timeout: patient name, date of birth, surgical site, and procedure verified   Procedure prep:  Patient was prepped and draped in usual sterile  fashion Prep type:  Isopropyl alcohol Anesthesia: the lesion was anesthetized in a standard fashion   Anesthetic:  1% lidocaine w/ epinephrine 1-100,000 buffered w/ 8.4% NaHCO3 Instrument used: flexible razor blade   Hemostasis achieved with: pressure, aluminum chloride and electrodesiccation   Outcome: patient tolerated procedure well   Post-procedure details: sterile dressing applied and wound care instructions given   Dressing type: bandage and petrolatum    Destruction of lesion Complexity: extensive   Destruction method: electrodesiccation and curettage   Informed consent: discussed and consent obtained   Timeout:  patient name, date of birth, surgical site, and procedure verified Procedure prep:  Patient was prepped and draped in usual sterile fashion Prep type:  Isopropyl alcohol Anesthesia: the lesion was anesthetized in a standard fashion   Anesthetic:  1% lidocaine w/ epinephrine 1-100,000 buffered w/ 8.4% NaHCO3 Curettage performed in three different directions: Yes   Electrodesiccation performed over the curetted area: Yes   Curettage cycles:  3 Lesion length (cm):  1.2 Lesion width (cm):  1.2 Final wound size (cm):  2.1 Hemostasis achieved with:  pressure and aluminum chloride Outcome: patient tolerated procedure well with no complications   Post-procedure details: sterile dressing applied and wound care instructions given   Dressing type: bandage and petrolatum    Specimen 1 - Surgical pathology Differential Diagnosis: R/O BCC  Check Margins: No  AK (actinic keratosis) (20) Scalp and face, left ear x20  Actinic keratoses are precancerous spots that appear secondary to cumulative UV radiation exposure/sun exposure over time. They are chronic with expected duration  over 1 year. A portion of actinic keratoses will progress to squamous cell carcinoma of the skin. It is not possible to reliably predict which spots will progress to skin cancer and so treatment is  recommended to prevent development of skin cancer.  Recommend daily broad spectrum sunscreen SPF 30+ to sun-exposed areas, reapply every 2 hours as needed.  Recommend staying in the shade or wearing long sleeves, sun glasses (UVA+UVB protection) and wide brim hats (4-inch brim around the entire circumference of the hat). Call for new or changing lesions.  Destruction of lesion - Scalp and face, left ear x20 (20) Complexity: simple   Destruction method: cryotherapy   Informed consent: discussed and consent obtained   Timeout:  patient name, date of birth, surgical site, and procedure verified Lesion destroyed using liquid nitrogen: Yes   Region frozen until ice ball extended beyond lesion: Yes   Outcome: patient tolerated procedure well with no complications   Post-procedure details: wound care instructions given   Additional details:  Prior to procedure, discussed risks of blister formation, small wound, skin dyspigmentation, or rare scar following cryotherapy. Recommend Vaseline ointment to treated areas while healing.     Return for TBSE As Scheduled.  I, Lawson Radar, CMA, am acting as scribe for Armida Sans, MD.   Documentation: I have reviewed the above documentation for accuracy and completeness, and I agree with the above.  Armida Sans, MD

## 2023-08-23 ENCOUNTER — Telehealth: Payer: Self-pay

## 2023-08-23 LAB — SURGICAL PATHOLOGY

## 2023-08-23 NOTE — Telephone Encounter (Signed)
Patient informed of pathology results 

## 2023-08-23 NOTE — Telephone Encounter (Signed)
-----   Message from Armida Sans sent at 08/23/2023  5:24 PM EST ----- FINAL DIAGNOSIS        1. Skin, left preauricular area :       BASAL CELL CARCINOMA, NODULAR AND INFILTRATIVE PATTERNS   Cancer = BCC Already treated Recheck next visit

## 2023-10-12 ENCOUNTER — Ambulatory Visit: Payer: Medicare HMO | Admitting: Oncology

## 2023-10-12 ENCOUNTER — Other Ambulatory Visit: Payer: Medicare HMO

## 2023-10-16 ENCOUNTER — Other Ambulatory Visit: Payer: Self-pay

## 2023-10-16 DIAGNOSIS — D509 Iron deficiency anemia, unspecified: Secondary | ICD-10-CM

## 2023-10-17 ENCOUNTER — Inpatient Hospital Stay (HOSPITAL_BASED_OUTPATIENT_CLINIC_OR_DEPARTMENT_OTHER): Payer: Medicare HMO | Admitting: Oncology

## 2023-10-17 ENCOUNTER — Encounter: Payer: Self-pay | Admitting: Oncology

## 2023-10-17 ENCOUNTER — Inpatient Hospital Stay: Payer: Medicare HMO

## 2023-10-17 ENCOUNTER — Inpatient Hospital Stay: Payer: Medicare HMO | Attending: Oncology

## 2023-10-17 VITALS — BP 92/53 | HR 74 | Temp 98.0°F | Resp 17 | Wt 198.3 lb

## 2023-10-17 DIAGNOSIS — Z923 Personal history of irradiation: Secondary | ICD-10-CM | POA: Diagnosis not present

## 2023-10-17 DIAGNOSIS — Z8589 Personal history of malignant neoplasm of other organs and systems: Secondary | ICD-10-CM

## 2023-10-17 DIAGNOSIS — Z79899 Other long term (current) drug therapy: Secondary | ICD-10-CM | POA: Diagnosis not present

## 2023-10-17 DIAGNOSIS — Z08 Encounter for follow-up examination after completed treatment for malignant neoplasm: Secondary | ICD-10-CM

## 2023-10-17 DIAGNOSIS — Z87891 Personal history of nicotine dependence: Secondary | ICD-10-CM | POA: Insufficient documentation

## 2023-10-17 DIAGNOSIS — Z9221 Personal history of antineoplastic chemotherapy: Secondary | ICD-10-CM | POA: Insufficient documentation

## 2023-10-17 DIAGNOSIS — Z85810 Personal history of malignant neoplasm of tongue: Secondary | ICD-10-CM | POA: Insufficient documentation

## 2023-10-17 DIAGNOSIS — D509 Iron deficiency anemia, unspecified: Secondary | ICD-10-CM | POA: Insufficient documentation

## 2023-10-17 LAB — CMP (CANCER CENTER ONLY)
ALT: 16 U/L (ref 0–44)
AST: 16 U/L (ref 15–41)
Albumin: 3.2 g/dL — ABNORMAL LOW (ref 3.5–5.0)
Alkaline Phosphatase: 62 U/L (ref 38–126)
Anion gap: 12 (ref 5–15)
BUN: 34 mg/dL — ABNORMAL HIGH (ref 8–23)
CO2: 26 mmol/L (ref 22–32)
Calcium: 9 mg/dL (ref 8.9–10.3)
Chloride: 99 mmol/L (ref 98–111)
Creatinine: 0.7 mg/dL (ref 0.61–1.24)
GFR, Estimated: >60 mL/min (ref >60)
Glucose, Bld: 171 mg/dL — ABNORMAL HIGH (ref 70–99)
Potassium: 4.8 mmol/L (ref 3.5–5.1)
Sodium: 137 mmol/L (ref 135–145)
Total Bilirubin: 0.6 mg/dL (ref 0.0–1.2)
Total Protein: 7 g/dL (ref 6.5–8.1)

## 2023-10-17 LAB — CBC WITH DIFFERENTIAL (CANCER CENTER ONLY)
Abs Immature Granulocytes: 0.06 10*3/uL (ref 0.00–0.07)
Basophils Absolute: 0 10*3/uL (ref 0.0–0.1)
Basophils Relative: 0 %
Eosinophils Absolute: 0.1 10*3/uL (ref 0.0–0.5)
Eosinophils Relative: 1 %
HCT: 35.4 % — ABNORMAL LOW (ref 39.0–52.0)
Hemoglobin: 11.7 g/dL — ABNORMAL LOW (ref 13.0–17.0)
Immature Granulocytes: 1 %
Lymphocytes Relative: 10 %
Lymphs Abs: 1 10*3/uL (ref 0.7–4.0)
MCH: 32.7 pg (ref 26.0–34.0)
MCHC: 33.1 g/dL (ref 30.0–36.0)
MCV: 98.9 fL (ref 80.0–100.0)
Monocytes Absolute: 0.6 10*3/uL (ref 0.1–1.0)
Monocytes Relative: 7 %
Neutro Abs: 7.5 10*3/uL (ref 1.7–7.7)
Neutrophils Relative %: 81 %
Platelet Count: 298 10*3/uL (ref 150–400)
RBC: 3.58 MIL/uL — ABNORMAL LOW (ref 4.22–5.81)
RDW: 12.9 % (ref 11.5–15.5)
WBC Count: 9.2 10*3/uL (ref 4.0–10.5)
nRBC: 0 % (ref 0.0–0.2)

## 2023-10-17 NOTE — Progress Notes (Signed)
 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  S/p total glossectomy with bilateral neck dissection on 07/15/20 at [facility].  PEG placed on 09/22/20 at [facility].  Patient completed chemotherapy on 11/30/20 and radiation on 12/03/20. Currently on surveillance.  Met with patient following MD visit.  Patient has been giving glucerna 1.5, 7 cartons daily (3 cartons in am, 2 cartons at lunch and 2 cartons at dinner).  Flushes with 240-335ml water at each feeding.  Reports bowel movement every other day.  Reports since Christmas has not been able to sip on liquids as before.  Is awaiting evaluation (swallow study and meet with MD) on 2/6 at [facility].    Will begin PT on neck soon due to arthritis causing pain  Reviewed GI note from 06/12/23.  Holding off on colonoscopy and endoscopy (last on 03/2020) with + fecal occult blood.      Medications: liquid Fe, MVI, metformin, glipzide  Labs: glucose 171, BUN 34, creatinine 0.70, albumin 3.2, Hgb 11.7  Anthropometrics:   Weight 198 lb 4.8 oz (wearing jacket) 190 lb 3.2 oz on 7/10 191 lb on 1/3 183 lb on 7/13 179 lb on 12/29/21   Estimated Energy Needs  Kcals: 2150-2580 Protein: 108-129 g Fluid: 2150-2580 ml  NUTRITION DIAGNOSIS: Inadequate oral intake, relying on feeding tube for nutrition   INTERVENTION:  Continue glucerna 1.5, 7 cartons per day (3 cartons at breakfast, 2 cartons at lunch and 2 cartons at supper). Flush with 240-300ml of water at each feeding (TID) Prostat has been stopped. If has to go back to [employer] will need to restart prostat Provides 2492 calories, 137 g protein and 1980-2160 ml free water If weight continues to increase may need to decrease to 6 cartons of glucerna 1.5 Receives enteral supplies from [facility].   Continue liquid Fe via feeding tube Patient has RD contact number    MONITORING, EVALUATION, GOAL: weight trends, intake   NEXT VISIT: Wed, July 15 after MD appt  Denys Labree B. Leighton Punches, RD,  LDN Registered Dietitian [phone]

## 2023-10-18 NOTE — Progress Notes (Signed)
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 Lendell Caprice for a lesion that he identified on his left tongue.  Biopsy on 06/15/2020 was consistent with squamous cell carcinoma.  This was followed by a CT neck and chest.  CT neck showed a large poorly defined infiltrative mass throughout the posterior left oral tongue and left oral base extending to the left glossotonsillar sulcus.  The mass crosses midline through the lingual septum into the right hemitongue.  Findings are concerning for left lateral retropharyngeal as well as level 1 and 4 nodal metastases.  CT chest without contrast did not show any evidence of metastatic disease.  Patient then underwent total glossectomy with bilateral neck dissection on 07/15/2020.   Pathology showed:Surgical Pathology Final Pathologic Diagnosis  A. LYMPH NODE, LEFT LEVEL 1B, DISSECTION: Benign salivary gland tissue. One out of six lymph nodes, positive for metastatic carcinoma (1/6).   B. LYMPH NODES, LEFT LEVEL 2A, 3, & 4, DISSECTION: Sixteen lymph nodes, negative for metastatic carcinoma (0/16).   C. LYMPH NODES, LEFT LEVEL 2B, DISSECTION: Seven lymph nodes, negative for metastatic carcinoma (0/7).   D. LYMPH NODES, LEFT LEVEL 1A, DISSECTION: One lymph node, negative for metastatic carcinoma  (0/1).   E. LYMPH NODES, RIGHT LEVEL 2A, 3, &4, DISSECTION: Eleven lymph nodes, negative for metastatic carcinoma (0/11).   F. LYMPH NODES, RIGHT LEVEL 2A AND 2B, DISSECTION: Eleven lymph nodes, negative for metastatic carcinoma(0/11).   G. LINGUAL NERVE, RIGHT, EXCISION: Benign nerve tissue. No malignancy identified.   H. LYMPH NODES, RIGHT LEVEL 1B, DISSECTION: Benign salivary gland tissue. Three lymph nodes, negative for metastatic carcinoma. (0/3).   I. LINGUAL NERVE, PROXIMAL LEFT MARGIN, EXCISION: Benign peripheral nerve. No malignancy identified.   J. FLOOR OF MOUTH, LEFT MARGIN, EXCISION: Benign oropharyngeal mucosa. No malignancy identified.   K. SOFT PALATE, LEFT MARGIN, EXCISION: Benign oropharyngeal mucosa. No malignancy identified.   L. PHARYNGEAL MUCOSAL MARGIN, LEFT, EXCISION: Benign oropharyngeal mucosa. No malignancy identified.   M. VALLECULA MUCOSA MARGIN, EXCISION: Benign oropharyngeal mucosa. No malignancy identified.   N. PTERYGOID, DEEP TONSILLAR FOSSA MARGIN, EXCISION: Benign soft tissue. No malignancy identified.   O. TONGUE, TOTAL GLOSSECTOMY: Invasive moderately differentiated squamous cell carcinoma, keratinizing (4.6 cm). Tumor focally present at the deep and left tissue edges, please see separated margin status.  Lymphovascular invasion identified.  Perineural invasion is identified. Pathologic stage: pT4a pN1.  See CAP synoptic report.     Patient was evaluated by radiation oncology at [facility] and plan was to offer adjuvant concurrent chemoradiation.  However patient lives in [place] and prefers to get care locally.   He also underwent tracheostomy removal, port placement and PEG tube placement at [facility].  Patient completed concurrent chemoradiation with weekly cisplatin in March 2022.He remains in remission    Interval history-patient is having some difficulty swallowing and is therefore using PEG tube entirely for  nutrition.  He  is following up with ENT at [facility]  ECOG PS- 1 Pain scale- 0   Review of systems- Review of Systems  Constitutional:  Negative for chills, fever, malaise/fatigue and weight loss.  HENT:  Negative for congestion, ear discharge and nosebleeds.   Eyes:  Negative for blurred vision.  Respiratory:  Negative for cough, hemoptysis, sputum production, shortness of breath and wheezing.   Cardiovascular:  Negative for chest pain, palpitations, orthopnea and claudication.  Gastrointestinal:  Negative for abdominal pain, blood in stool, constipation, diarrhea, heartburn, melena, nausea and vomiting.  Genitourinary:  Negative for dysuria, flank pain, frequency, hematuria and urgency.  Musculoskeletal:  Negative for back pain, joint pain and myalgias.  Skin:  Negative for rash.  Neurological:  Negative for dizziness, tingling, focal weakness, seizures, weakness and headaches.  Endo/Heme/Allergies:  Does not bruise/bleed easily.  Psychiatric/Behavioral:  Negative for depression and suicidal ideas. The patient does not have insomnia.       Allergies  Allergen Reactions   Tape     Redness and bleeding with some tapes and adhesives.     Past Medical History:  Diagnosis Date   Actinic keratosis 03/03/2010   left dorsum hand, bx proven   Atypical mole 03/21/2023   left posterior waistline, moderate atypia   Basal cell carcinoma 08/22/2023   L preauricular - ED&C     History reviewed. No pertinent surgical history.  Social History   Socioeconomic History   Marital status: Widowed    Spouse name: Not on file   Number of children: Not on file   Years of education: Not on file   Highest education level: Not on file  Occupational History   Not on file  Tobacco Use   Smoking status: Former    Types: Cigarettes   Smokeless tobacco: Never   Tobacco comments:    60 years ago  Vaping Use   Vaping status: Never Used  Substance and Sexual Activity   Alcohol use: Not  Currently   Drug use: Never   Sexual activity: Not Currently  Other Topics Concern   Not on file  Social History Narrative   Not on file   Social Drivers of Health   Financial Resource Strain: Low Risk  (08/01/2023)   Received from [facility]   Overall Financial Resource Strain (CARDIA)    Difficulty of Paying Living Expenses: Not hard at all  Food Insecurity: No Food Insecurity (08/01/2023)   Received from [facility]   Hunger Vital Sign    Worried About Running Out of Food in the Last Year: Never true    Ran Out of Food in the Last Year: Never true  Transportation Needs: No Transportation Needs (08/01/2023)   Received from [facility] - Transportation    In the past 12 months, has lack of transportation kept you from medical appointments or from getting medications?: No    Lack of Transportation (Non-Medical): No  Physical Activity: Not on file  Stress: Not on file  Social Connections: Not on file  Intimate Partner Violence: Not on file    History reviewed. No pertinent family history.   Current Outpatient Medications:    Ascorbic Acid (VITAMIN C) 1000 MG tablet, Take 1,000 mg by mouth daily., Disp: , Rfl:    DENTAGEL 1.1 % GEL dental gel, PLEASE SEE ATTACHED FOR DETAILED DIRECTIONS, Disp: , Rfl:    enalapril (VASOTEC) 5 MG tablet, Take 5 mg by mouth daily., Disp: ,  Rfl:    glipiZIDE (GLUCOTROL) 10 MG tablet, Take 10 mg by mouth daily before breakfast., Disp: , Rfl:    LORazepam (ATIVAN) 0.5 MG tablet, Take 1 tablet (0.5 mg total) by mouth every 8 (eight) hours as needed for anxiety., Disp: 30 tablet, Rfl: 0   metFORMIN (GLUCOPHAGE) 500 MG tablet, Take 1,000 mg by mouth daily. Take two once daily, Disp: , Rfl:    Multiple Vitamin (MULTI-VITAMIN) tablet, Take 1 tablet by mouth daily., Disp: , Rfl:    pravastatin (PRAVACHOL) 40 MG tablet, Take 1 tablet by mouth daily., Disp: , Rfl:   Physical exam:  Vitals:    10/17/23 1306  BP: (!) 92/53  Pulse: 74  Resp: 17  Temp: 98 F (36.7 C)  TempSrc: Tympanic  SpO2: 96%  Weight: 198 lb 4.8 oz (89.9 kg)   Physical Exam HENT:     Mouth/Throat:     Mouth: Mucous membranes are moist.     Pharynx: Oropharynx is clear.     Comments: Changes of glossectomy and reconstruction Cardiovascular:     Rate and Rhythm: Normal rate and regular rhythm.     Heart sounds: Normal heart sounds.  Pulmonary:     Effort: Pulmonary effort is normal.     Breath sounds: Normal breath sounds.  Abdominal:     General: Bowel sounds are normal.     Palpations: Abdomen is soft.     Comments: PEG tube in place  Skin:    General: Skin is warm and dry.  Neurological:     Mental Status: He is alert and oriented to person, place, and time.         Latest Ref Rng & Units 10/17/2023   12:29 PM  CMP  Glucose 70 - 99 mg/dL 956   BUN 8 - 23 mg/dL 34   Creatinine 2.13 - 1.24 mg/dL 0.86   Sodium 578 - 469 mmol/L 137   Potassium 3.5 - 5.1 mmol/L 4.8   Chloride 98 - 111 mmol/L 99   CO2 22 - 32 mmol/L 26   Calcium 8.9 - 10.3 mg/dL 9.0   Total Protein 6.5 - 8.1 g/dL 7.0   Total Bilirubin 0.0 - 1.2 mg/dL 0.6   Alkaline Phos 38 - 126 U/L 62   AST 15 - 41 U/L 16   ALT 0 - 44 U/L 16       Latest Ref Rng & Units 10/17/2023   12:29 PM  CBC  WBC 4.0 - 10.5 K/uL 9.2   Hemoglobin 13.0 - 17.0 g/dL 62.9   Hematocrit 52.8 - 52.0 % 35.4   Platelets 150 - 400 K/uL 298     Assessment and plan- Patient is a 73 y.o. male with history of squamous cell carcinoma of the oral cavity s/p surgery followed by concurrent chemoradiation currently in remission here for routine follow-up  Patient had CT soft tissue neck with contrast at [facility] on 09/28/2023 which did not show any evidence of recurrent or progressive disease.  He was also seen by Dr. Lendell Caprice 2 days ago and will be undergoing repeat NPL exam next month as well.  I will see him in 6 months.  Labs suggestive of mild iron  deficiency anemia and patient is continuing to take liquid iron through his PEG tube.  He is meeting with nutrition today   Visit Diagnosis 1. Encounter for follow-up surveillance of head and neck cancer      Dr. Owens Shark, MD, MPH [facility] at [facility]  [facility] [phone] 10/18/2023 10:29 AM

## 2023-11-07 NOTE — Progress Notes (Signed)
 Speech-Language Pathology Evaluation  OP-SLP Flexible Endoscopic Evaluation of Swallowing ( FEES)  Payor: [employer] MEDICARE ADV / Plan: AETNA [place] / Product Type: Medicare Advantage /  Demographics:  Age: 73 y.o.  Gender: male  Referring Diagnosis: No data recorded Referring Clinician:  Floretta Lonni LABOR*  Subjective: Pleasant 73 y/o patient presents to outpatient speech for instrumental evaluation of swallowing function.  Past Medical History:  Diagnosis Date  . Cancer (CMD)   . Diabetes mellitus (CMD)   . Hypercholesterolemia   . Hypertension   . Speech disorder     Past Surgical History:  Procedure Laterality Date  . ADENOIDECTOMY     Procedure: ADENOIDECTOMY  . COLONOSCOPY     Procedure: COLONOSCOPY  . FREE FLAP RADIAL FOREARM Right 07/30/2020   Procedure: FREE FLAP RADIAL FOREARM;  Surgeon: Fonda Laraine Muscat, MD;  Location: [facility] MAIN OR;  Service: ENT;  Laterality: Right;  . GASTROSTOMY TUBE PLACEMENT N/A 09/22/2020   Procedure: GASTROSTOMY TUBE PLACEMENT LAPAROSCOPIC;  Surgeon: Gladis Alm Serge, MD;  Location: [facility] MAIN OR;  Service: Trauma;  Laterality: N/A;  . GLOSSECTOMY Bilateral 07/30/2020   Procedure: GLOSSECTOMY , bilateral modified radical neck dissection, tracheotomy, free flap reconstruction, 8h;  Surgeon: Lonni LABOR Floretta, MD;  Location: [facility] MAIN OR;  Service: ENT;  Laterality: Bilateral;  . NECK DISSECTION Bilateral 07/30/2020   Procedure: NECK DISSECTION MODIFIED RADICAL <6HRS;  Surgeon: Lonni LABOR Floretta, MD;  Location: [facility] MAIN OR;  Service: ENT;  Laterality: Bilateral;  . PORTACATH PLACEMENT N/A 09/22/2020   Procedure: PORT-A-CATH INSERTION;  Surgeon: Gladis Alm Serge, MD;  Location: [facility] MAIN OR;  Service: Trauma;  Laterality: N/A;  . TONSILLECTOMY     Procedure: TONSILLECTOMY  . TOOTH EXTRACTION N/A 09/22/2020   Procedure: DENTAL EXTRACTIONS, 7 molars from all four quadrants of mouth;  Surgeon: Alm Manus Heimlich, DDS;  Location:  [facility] MAIN OR;  Service: Dentistry;  Laterality: N/A;    Dr. Floretta from 10/15/2023: Head & Neck Surgery Follow-up Clinic Visit ID: 07/15/20: s/p total gloss, trach, b/l ND, right ALT for pT4a pN1 tongue SCC.   Radiation Oncology: Marcey Penton, MD [facility]) consult 09/06/20 Medical Oncology: Annah Skene, MD [facility])  consult 09/06/20 Dentistry: 09/10/20 at 9:00 AM EGS 09/07/20 Subjective: Ronnie Buckley is a 73 y.o. male seen in follow up today.  Doing well.  I last saw him 04/25/2023. CT neck 09/28/23 was negative for recurrent cancer.  He continues to have expected dysphagia associated with his reconstruction.  Assessment:  My impression is that Ronnie Buckley has  1. Malignant neoplasm of tongue, unspecified (CMD)   2. Dysphagia, unspecified type     .   Plan Plan:  1. Ronnie Buckley continues to do very well with no evidence of recurrent cancer.  I will plan to see him back in 6 mos   I have personally spent 20 minutes involved in face-to-face and non-face-to-face activities for this patient on the day of the visit regarding the etiology, pathogenesis and management of:  1. Malignant neoplasm of tongue, unspecified (CMD)   2. 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 Oral stage is marked by post-surgical changes s/p total glossectomy and diminished oral sensation, resulting in poor A/P transit of puree bolus (was not successfully transported into the pharynx, and oral residual after the swallow. Pharyngeal stage is marked by significantly decreased  bolus propulsion, incomplete airway closure, suspected decreased hyolaryngeal excursion, and likely decreased UES distension. Deficits result in moderate to severe post-swallow residual with thin and nectar-thick liquid that did not clear  with subsequent swallowing, and aspiration of thin liquid during the swallow and suspected aspiration of nectar-thick liquid after the swallow. Cued throat clearing was not effective at clearing aspirants. PAS scores above. Based on today's findings, no safe oral diet can be recommended at this time. Recommend patient continue nutrition/hydration/medication through the G-tube, with teaspoon sips of water and/or ice chips by mouth following meticulous oral care. Provided risks and adverse outcomes associated with dysphagia and aspiration (i.e possible life-threatening pneumonia) and rationale for adherence to management recommendations. Provided pre-radiation education. Did discuss that swallowing rehabilitation will likely be prolonged in the setting of acute side effects of XRT. Did advise patient to initiate swallowing exercises as soon as possible. A handout outlining the Effortful Swallow and Mendelsohn Maneuver were provided to the patient. Patient was able to complete 2-3 repetitions of the Effortful Swallow, but will need ongoing practice with the [facility]. Did advise patient to have continued follow up with SLP team at [facility] (where patient will be getting treatment). Patient will inquire about SLP services at his appointment tomorrow. Will plan to re-evaluate patient post-treatment in 4 to 6 weeks, in coordination with ENT follow up. Patient verbalized understanding of all exam results and recommendations today.   Dysphagia: Today patient reports that for over 3 years he was able to drink liquids PO. Then about Thanksgiving 2024 he felt like his swallowing worsened and he is unable to swallowing anything PO.   Coughing/choking with oral intake: endorses Food/liquid going down the wrong pipe: denies Food/liquid getting stuck in throat: endorses Pain during swallowing: denies  Related Problems:  Xerostomia: denies Dysguesia: endorses Neck or throat pain: denies Chewing/dentition  problems: N/a Secretions: endorses  Respiratory/Pulmonary:  Oxygen use: room air, denies History of pneumonia: denies  Nutrition: Current pre-evaluation diet: NPO currently; previously was sipping on water throughout the day, taking occasional tea and coffee Weight information: stable Current feeding tube/use: 7 cartons a day  Wt Readings from Last 10 Encounters:  10/15/23 83.9 kg (185 lb)  04/25/23 83.5 kg (184 lb)  10/25/22 81.6 kg (180 lb)  06/21/22 78.5 kg (173 lb)  02/20/22 78.5 kg (173 lb)  10/12/21 81.6 kg (180 lb)  10/11/21 81.6 kg (180 lb)  06/22/21 83.9 kg (185 lb)  03/09/21 83.9 kg (185 lb)  01/26/21 85.2 kg (187 lb 12.8 oz)   Objective:  ORAL MECHANISM EXAMINATION:  Neck: reduced bilaterally, reduced shoulder shrug left Jaw: moderate-severe trismus, 18 mm Facial weakness: subtle left lower weakness Lips: reduced retraction left lower, nasal air escape with breath hold Tongue strength: absent--flap Tongue mobility: absent--flap Palate: reduced elevation bilaterally, scarring left DDK rate, rhythm, articulation:  mod-severe dysarthria Orofacial sensation to light touch: DNT  Oral Health Status Screening: Natural dentition: Yes Dentures or plates: No Condition of natural teeth: good   Oral Hygiene Assessment: Oral secretions/debris: some thick secretions Home oral care practices: brushes daily and uses water pick, does not have suction  Laryngeal exam: Voice quality description: mildly rough, patient denies changes Laryngeal elevation: patient did not successfully initiate a swallow Ability to cough/throat clear: present  VELOPHARYNX/LARYNX ASSESSMENT: Flexible endoscope was passed transnasally through the right nare to obtain a superior view of the pharynx and larynx.   ANATOMY:   Velopharyngeal Closure: Incomplete Tongue Base:? absent--flap reconstruction Epiglottis:? WNL Vocal Fold Movement:? WNL  Secretion Rating: 0 = within normal  limits  SWALLOWING PHYSIOLOGY ASSESSMENT:  Oral Stage: Premature loss to pharynx  Pharyngeal Stage Initiation of Pharyngeal Swallow: Delayed Pharyngeal Constriction:  largely absent Tongue Base Retraction: absent UES distention: suspect reduced Nasal regurgitation: not tested Esophageal backflow: not present Residue levels: Severe  Penetration-Aspiration Scale (PAS)  1 Material does not enter airway  2 Material enters the airway, remains above the vocal folds and is ejected from the airway   3 Material enters the airway, remains above the vocal folds and is not ejected from the airway   4 Material enters the airway, contacts the vocal folds and is ejected from the airway   5 Material enters the airway, contacts the vocal folds and is not ejected from the airway   6 Material enters the airway, passes below the vocal folds and is ejected into the larynx or out of the airway   7 Material enters the airway, passes below the vocal folds and is not ejected from the trachea despite effort   8 Material enters the airway, passes below the vocal folds and no effort is made to eject    Penetration Aspiration - Thin PAS: 2, 3, and 6 Timing: During swallow and After swallow  Compensatory Strategies tested: Throat clear/cough partially effective at clearing the airway and Multiple swallows partially effective at clearing residue  ASSESSMENT: 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 Dysphagia is marked by absent oral control and A/P transit (except by gravity) due to total glossectomy, minimal to no bolus propulsion, reduced hyolaryngeal excursion, incomplete airway closure, delayed swallow initiation, and limited UES distention. Deficits result in uncontrolled flow of bolus into pharynx, minimal pharyngeal clearance, and penetration/transient aspiration during and after the swallow. Patient ultimately had to expectorate bolus  residue.  At this time recommend patient continue full rate of feeding, hydration, and all medication via G tube. Did recommend that he re-initiate limited amounts of water PO to assist with secretion management and mobilizing swallowing musculature. He will need to expectorate residue and secretions during intake. Discussed a course of swallowing therapy to target deficits, especially trismus. Advised that we can also try pharyngeal strengthening exercises with the understanding that a reasonable goal would be return to intake of liquids. He could also potentially benefit from dilation at ENT discretion. Patient verbalized understanding of all exam results and recommendations. Exam results reviewed with ENT Dr. Delayne following the study.   PLAN:  Diet Recommendations: full rate of nutrition, hydration, and all medication via G tube; limited intake of water for maintenance and secretion management Medication Recommendations: Via alternate means Recommended Strategies and Precautions: throat clear and expectorate residue, small bolus size Other Recommendations: dilation at physician discretion, swallowing therapy  Treatment Frequency and Duration:  4 sessions total, initially one week apart, and then two weeks apart after the first two  Speech Pathology Plan of Care:   This patient will achieve the following goals during dysphagia therapy. Swallowing therapy will include oral and pharyngeal strengthening exercises (60-100 per session) with the use of lingual manometry and endoscopic biofeedback as needed.  Long Term Goal: This patient will improve oropharyngeal swallowing function to resume the least restrictive diet possible and/or alleviate symptoms of dysphagia.   Short Term Goals: This patient will achieve the following goals:  Goal 1: Patient will participate in exercises to improve laryngeal closure and hyolaryngeal excursion to facilitate improved swallowing safety and efficiency as  measured by decreased residue levels and  decreased amount, depth, and/or frequency of airway invasion on FEES or MBS. Goal 2: Patient will participate in exercises to improve base of tongue retraction to facilitate improved swallowing efficiency as measured by decreased residue levels on FEES or MBS. Goal 3: Patient will participate in exercises to improve pharyngeal constriction during swallowing to facilitate improved swallowing safety and efficiency as measured by decreased residue levels and decreased amount, depth, and/or frequency of airway invasion on FEES or MBS. Goal 4: Patient will participate in trismus exercises to increase oral aperture  Progress of skills will be monitored during therapy and measured objectively. Improvement in oropharyngeal swallowing ability will be measured by FEES or Pharyngeal Function Study during/after swallowing therapy. Plan of care will be updated as needed.   Rehabilitation Potential:  Motivation/Commitment to Therapy: Good  Rehabilitation Potential: Fair  Support Structure: Good  Friend willing to assist  Time in: 1100 Time out: 1150

## 2024-03-19 ENCOUNTER — Ambulatory Visit: Payer: Medicare HMO | Admitting: Dermatology

## 2024-04-02 ENCOUNTER — Ambulatory Visit: Admitting: Dermatology

## 2024-04-07 ENCOUNTER — Other Ambulatory Visit: Payer: Self-pay

## 2024-04-07 DIAGNOSIS — D509 Iron deficiency anemia, unspecified: Secondary | ICD-10-CM

## 2024-04-08 ENCOUNTER — Inpatient Hospital Stay

## 2024-04-08 ENCOUNTER — Encounter: Payer: Self-pay | Admitting: Oncology

## 2024-04-08 ENCOUNTER — Inpatient Hospital Stay: Admitting: Oncology

## 2024-04-08 ENCOUNTER — Inpatient Hospital Stay: Attending: Oncology

## 2024-04-08 ENCOUNTER — Ambulatory Visit: Payer: Self-pay | Admitting: Oncology

## 2024-04-08 ENCOUNTER — Other Ambulatory Visit: Payer: Self-pay | Admitting: Oncology

## 2024-04-08 VITALS — BP 134/65 | HR 75 | Temp 96.6°F | Resp 14 | Wt 201.0 lb

## 2024-04-08 DIAGNOSIS — Z08 Encounter for follow-up examination after completed treatment for malignant neoplasm: Secondary | ICD-10-CM | POA: Diagnosis not present

## 2024-04-08 DIAGNOSIS — D509 Iron deficiency anemia, unspecified: Secondary | ICD-10-CM | POA: Diagnosis present

## 2024-04-08 DIAGNOSIS — Z8589 Personal history of malignant neoplasm of other organs and systems: Secondary | ICD-10-CM | POA: Diagnosis not present

## 2024-04-08 DIAGNOSIS — Z9221 Personal history of antineoplastic chemotherapy: Secondary | ICD-10-CM | POA: Diagnosis not present

## 2024-04-08 DIAGNOSIS — Z87891 Personal history of nicotine dependence: Secondary | ICD-10-CM | POA: Insufficient documentation

## 2024-04-08 DIAGNOSIS — Z85828 Personal history of other malignant neoplasm of skin: Secondary | ICD-10-CM | POA: Diagnosis not present

## 2024-04-08 DIAGNOSIS — Z923 Personal history of irradiation: Secondary | ICD-10-CM | POA: Insufficient documentation

## 2024-04-08 DIAGNOSIS — Z85810 Personal history of malignant neoplasm of tongue: Secondary | ICD-10-CM | POA: Diagnosis not present

## 2024-04-08 DIAGNOSIS — Z79899 Other long term (current) drug therapy: Secondary | ICD-10-CM | POA: Insufficient documentation

## 2024-04-08 LAB — IRON AND TIBC
Iron: 79 ug/dL (ref 45–182)
Saturation Ratios: 20 % (ref 17.9–39.5)
TIBC: 388 ug/dL (ref 250–450)
UIBC: 309 ug/dL

## 2024-04-08 LAB — CBC WITH DIFFERENTIAL (CANCER CENTER ONLY)
Abs Immature Granulocytes: 0.02 K/uL (ref 0.00–0.07)
Basophils Absolute: 0 K/uL (ref 0.0–0.1)
Basophils Relative: 1 %
Eosinophils Absolute: 0.1 K/uL (ref 0.0–0.5)
Eosinophils Relative: 2 %
HCT: 37.7 % — ABNORMAL LOW (ref 39.0–52.0)
Hemoglobin: 12.4 g/dL — ABNORMAL LOW (ref 13.0–17.0)
Immature Granulocytes: 0 %
Lymphocytes Relative: 17 %
Lymphs Abs: 1.1 K/uL (ref 0.7–4.0)
MCH: 31.3 pg (ref 26.0–34.0)
MCHC: 32.9 g/dL (ref 30.0–36.0)
MCV: 95.2 fL (ref 80.0–100.0)
Monocytes Absolute: 0.6 K/uL (ref 0.1–1.0)
Monocytes Relative: 10 %
Neutro Abs: 4.4 K/uL (ref 1.7–7.7)
Neutrophils Relative %: 70 %
Platelet Count: 171 K/uL (ref 150–400)
RBC: 3.96 MIL/uL — ABNORMAL LOW (ref 4.22–5.81)
RDW: 14.2 % (ref 11.5–15.5)
WBC Count: 6.3 K/uL (ref 4.0–10.5)
nRBC: 0 % (ref 0.0–0.2)

## 2024-04-08 LAB — FERRITIN: Ferritin: 17 ng/mL — ABNORMAL LOW (ref 24–336)

## 2024-04-08 NOTE — Progress Notes (Signed)
 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 Floretta for a lesion that he identified on his left tongue.  Biopsy on 06/15/2020 was consistent with squamous cell carcinoma.  This was followed by a CT neck and chest.  CT neck showed a large poorly defined infiltrative mass throughout the posterior left oral tongue and left oral base extending to the left glossotonsillar sulcus.  The mass crosses midline through the lingual septum into the right hemitongue.  Findings are concerning for left lateral retropharyngeal as well as level 1 and 4 nodal metastases.  CT chest without contrast did not show any evidence of metastatic disease.  Patient then underwent total glossectomy with bilateral neck dissection on 07/15/2020.   Pathology showed:Surgical Pathology Final Pathologic Diagnosis  A. LYMPH NODE, LEFT LEVEL 1B, DISSECTION: Benign salivary gland tissue. One out of six lymph nodes, positive for metastatic carcinoma (1/6).   B. LYMPH NODES, LEFT LEVEL 2A, 3, & 4, DISSECTION: Sixteen lymph nodes, negative for metastatic carcinoma (0/16).   C. LYMPH NODES, LEFT LEVEL 2B, DISSECTION: Seven lymph nodes, negative for metastatic carcinoma (0/7).   D. LYMPH NODES, LEFT LEVEL 1A, DISSECTION: One lymph node, negative for metastatic  carcinoma (0/1).   E. LYMPH NODES, RIGHT LEVEL 2A, 3, &4, DISSECTION: Eleven lymph nodes, negative for metastatic carcinoma (0/11).   F. LYMPH NODES, RIGHT LEVEL 2A AND 2B, DISSECTION: Eleven lymph nodes, negative for metastatic carcinoma(0/11).   G. LINGUAL NERVE, RIGHT, EXCISION: Benign nerve tissue. No malignancy identified.   H. LYMPH NODES, RIGHT LEVEL 1B, DISSECTION: Benign salivary gland tissue. Three lymph nodes, negative for metastatic carcinoma. (0/3).   I. LINGUAL NERVE, PROXIMAL LEFT MARGIN, EXCISION: Benign peripheral nerve. No malignancy identified.   J. FLOOR OF MOUTH, LEFT MARGIN, EXCISION: Benign oropharyngeal mucosa. No malignancy identified.   K. SOFT PALATE, LEFT MARGIN, EXCISION: Benign oropharyngeal mucosa. No malignancy identified.   L. PHARYNGEAL MUCOSAL MARGIN, LEFT, EXCISION: Benign oropharyngeal mucosa. No malignancy identified.   M. VALLECULA MUCOSA MARGIN, EXCISION: Benign oropharyngeal mucosa. No malignancy identified.   N. PTERYGOID, DEEP TONSILLAR FOSSA MARGIN, EXCISION: Benign soft tissue. No malignancy identified.   O. TONGUE, TOTAL GLOSSECTOMY: Invasive moderately differentiated squamous cell carcinoma, keratinizing (4.6 cm). Tumor focally present at the deep and left tissue edges, please see separated margin status.  Lymphovascular invasion identified.  Perineural invasion is identified. Pathologic stage: pT4a pN1.  See CAP synoptic report.     Patient was evaluated by radiation oncology at [facility] and plan was to offer adjuvant concurrent chemoradiation.  However patient lives in [place] and prefers to get care locally.   He also underwent tracheostomy removal, port placement and PEG tube placement at [facility].  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  He is also completely dependent on his PEG tube feeds at this point and not taking anything p.o.  He follows up with speech pathology at [facility].  ECOG PS- 1 Pain scale- 0   Review of systems- Review of Systems  Constitutional:  Negative for chills, fever, malaise/fatigue and weight loss.  HENT:  Negative for congestion, ear discharge and nosebleeds.   Eyes:  Negative for blurred vision.  Respiratory:  Negative for cough, hemoptysis, sputum production, shortness of breath and wheezing.   Cardiovascular:  Negative for chest pain, palpitations, orthopnea and claudication.  Gastrointestinal:  Negative for abdominal pain, blood in stool, constipation, diarrhea, heartburn, melena, nausea and vomiting.  Genitourinary:  Negative for dysuria, flank pain, frequency, hematuria and urgency.  Musculoskeletal:  Negative for back pain, joint pain and myalgias.  Skin:  Negative for rash.  Neurological:  Negative for dizziness, tingling, focal weakness, seizures, weakness and headaches.  Endo/Heme/Allergies:  Does not bruise/bleed easily.  Psychiatric/Behavioral:  Negative for depression and suicidal ideas. The patient does not have insomnia.       Allergies  Allergen Reactions   Tape     Redness and bleeding with some tapes and adhesives.     Past Medical History:  Diagnosis Date   Actinic keratosis 03/03/2010   left dorsum hand, bx proven   Atypical mole 03/21/2023   left posterior waistline, moderate atypia   Basal cell carcinoma 08/22/2023   L preauricular - ED&C     No past surgical history on file.  Social History   Socioeconomic History   Marital status: Widowed    Spouse name: Not on file   Number of children: Not on file   Years of education: Not on file   Highest education level: Not on file  Occupational History   Not on file  Tobacco Use   Smoking status: Former    Types: Cigarettes   Smokeless tobacco:  Never   Tobacco comments:    60 years ago  Vaping Use   Vaping status: Never Used  Substance and Sexual Activity   Alcohol use: Not Currently   Drug use: Never   Sexual activity: Not Currently  Other Topics Concern   Not on file  Social History Narrative   Not on file   Social Drivers of Health   Financial Resource Strain: Low Risk  (02/06/2024)   Received from [facility]   Overall Financial Resource Strain (CARDIA)    Difficulty of Paying Living Expenses: Not hard at all  Food Insecurity: No Food Insecurity (02/06/2024)   Received from [facility]   Hunger Vital Sign    Within the past 12 months, you worried that your food would run out before you got the money to buy more.: Never true    Within the past 12 months, the food you bought just didn't last and you didn't have money to get more.: Never true  Transportation Needs: No Transportation Needs (02/06/2024)   Received from [facility] - Transportation    In the past 12 months, has lack of transportation kept you from medical appointments or from getting medications?: No    Lack of Transportation (Non-Medical): No  Physical Activity: Not on file  Stress: Not on file  Social Connections: Not on file  Intimate Partner Violence: Not on file    No family history on file.   Current Outpatient Medications:  Ascorbic Acid (VITAMIN C) 1000 MG tablet, Take 1,000 mg by mouth daily., Disp: , Rfl:    DENTAGEL 1.1 % GEL dental gel, PLEASE SEE ATTACHED FOR DETAILED DIRECTIONS, Disp: , Rfl:    enalapril (VASOTEC) 5 MG tablet, Take 5 mg by mouth daily., Disp: , Rfl:    glipiZIDE (GLUCOTROL) 10 MG tablet, Take 10 mg by mouth daily before breakfast., Disp: , Rfl:    LORazepam  (ATIVAN ) 0.5 MG tablet, Take 1 tablet (0.5 mg total) by mouth every 8 (eight) hours as needed for anxiety., Disp: 30 tablet, Rfl: 0   metFORMIN (GLUCOPHAGE) 500 MG tablet, Take 1,000 mg by mouth daily.  Take two once daily, Disp: , Rfl:    Multiple Vitamin (MULTI-VITAMIN) tablet, Take 1 tablet by mouth daily., Disp: , Rfl:    pravastatin (PRAVACHOL) 40 MG tablet, Take 1 tablet by mouth daily., Disp: , Rfl:   Physical exam:  Vitals:   04/08/24 1232  BP: 134/65  Pulse: 75  Resp: 14  Temp: (!) 96.6 F (35.9 C)  TempSrc: Tympanic  SpO2: 98%  Weight: 201 lb (91.2 kg)   Physical Exam HENT:     Mouth/Throat:     Mouth: Mucous membranes are moist.     Pharynx: Oropharynx is clear.     Comments: Changes of post glossectomy. Cardiovascular:     Rate and Rhythm: Normal rate and regular rhythm.     Heart sounds: Normal heart sounds.  Pulmonary:     Effort: Pulmonary effort is normal.     Breath sounds: Normal breath sounds.  Abdominal:     General: Bowel sounds are normal.     Palpations: Abdomen is soft.  Lymphadenopathy:     Comments: No palpable cervical adenopathy  Skin:    General: Skin is warm and dry.  Neurological:     Mental Status: He is alert and oriented to person, place, and time.      I have personally reviewed labs listed below:    Latest Ref Rng & Units 10/17/2023   12:29 PM  CMP  Glucose 70 - 99 mg/dL 828   BUN 8 - 23 mg/dL 34   Creatinine 9.38 - 1.24 mg/dL 9.29   Sodium 864 - 854 mmol/L 137   Potassium 3.5 - 5.1 mmol/L 4.8   Chloride 98 - 111 mmol/L 99   CO2 22 - 32 mmol/L 26   Calcium 8.9 - 10.3 mg/dL 9.0   Total Protein 6.5 - 8.1 g/dL 7.0   Total Bilirubin 0.0 - 1.2 mg/dL 0.6   Alkaline Phos 38 - 126 U/L 62   AST 15 - 41 U/L 16   ALT 0 - 44 U/L 16       Latest Ref Rng & Units 04/08/2024   12:19 PM  CBC  WBC 4.0 - 10.5 K/uL 6.3   Hemoglobin 13.0 - 17.0 g/dL 87.5   Hematocrit 60.9 - 52.0 % 37.7   Platelets 150 - 400 K/uL 171      Assessment and plan- Patient is a 73 y.o. male with history of squamous cell carcinoma of the oral cavity s/p surgery and concurrent chemoradiation.  He is presently in remission and this is a routine follow-up  visit  Clinically patient is doing well with no concerning signs and symptoms of recurrence based on today's exam.  He also follows up with ENT at [facility].  He is dependent on tube feeds since his surgery and also follows up with speech pathology.  He will be meeting nutrition again today.  Patient had CT soft tissue neck in June 2025 which showed extensive erosions involving the C1 lateral mass and adjacent C1 anterior neural arch and extensive erosions of subadjacent dens.  Anterolisthesis and rightward listhesis of C1 on C2.  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  This does not have typical appearance of metastatic disease.  This was followed by MRI cervical spine which also confirmed the same findings and inflammatory arthropathy versus osteoradionecrosis was a differential.  He was seen by orthopedics and conservative management was recommended with as needed pain medications  History of iron  deficiency anemia: Patient's Hemoglobin has presently improved to 12.4.  Iron  studies are currently pending.  If he needs IV iron  we will give him a call.  I will repeat labs in 6 months and see him thereafter   Visit Diagnosis 1. Encounter for follow-up surveillance of head and neck cancer      Dr. Annah Skene, MD, MPH [facility] at [facility] [phone] 04/08/2024 12:13 PM

## 2024-04-08 NOTE — Progress Notes (Signed)
 Pt in for follow up, reports having some issues with his neck.  Otherwise, doing well.

## 2024-04-08 NOTE — Progress Notes (Addendum)
 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  S/p total glossectomy with bilateral neck dissection on 07/15/20 at [facility].  PEG placed on 09/22/20 at [facility]   Met with patient following MD visit.  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    SLP notes reviewed from [facility] and recommendation for all nutrition to continue via feeding tube.    Patient taking 3 cartons of glucerna 1.5 at breakfast 2 cartons at lunch and supper.  Currently on reflux medication.  Wanting to reduce to 6 cartons per day with weight gain.  Has bowel movement daily or every other day.  Denies concerns with nutrition.   Medications: MVI, VIt C, metformin, glipizide, reflux medication  Labs: glucose 171 BUN 34, Creatinine 0.70 (normal)  Anthropometrics:   Height: 70 inches Weight: 201 lb 198 lb 4.8 oz on 10/17/23 (wearing jacket) 190 lb 3.2 oz on 7/10 191 lb on 1/3 183 lb on 7/13 179 lb on 12/29/21 BMI: 28   Estimated Energy Needs  Kcals: 2150 Protein: 103 g Fluid: 2150 ml  NUTRITION DIAGNOSIS: Inadequate oral intake relying on feeding tube   MALNUTRITION DIAGNOSIS: not at this time   INTERVENTION:  Patient wanting to decrease glucerna 1.5 to 6 cartons per day with weight gain. Flush with 4-5 cups additional fluid (water/gatorade) daily  Provides 2136 calories, 117 g protein, free water (plus additional fluid to total of fluid.   Discussed decrease in tube feeding with MD Discussed monitoring color of urine and increasing/decreasing fluid based off of this.  Patient verbalized understanding.   MONITORING, EVALUATION, GOAL: weight trends, tube feeding   NEXT VISIT: Friday, August 15 in infusion   Verdine Grenfell B. Dasie SOLON, CSO, LDN Registered Dietitian [phone]

## 2024-04-08 NOTE — Addendum Note (Signed)
 Addended by: JASMINE DELON POUR on: 04/08/2024 01:45 PM   Modules accepted: Orders

## 2024-04-15 ENCOUNTER — Ambulatory Visit: Payer: Medicare HMO | Admitting: Oncology

## 2024-04-15 ENCOUNTER — Other Ambulatory Visit: Payer: Medicare HMO

## 2024-04-25 ENCOUNTER — Inpatient Hospital Stay

## 2024-04-28 ENCOUNTER — Inpatient Hospital Stay

## 2024-04-28 VITALS — BP 118/60 | HR 66 | Temp 97.9°F | Resp 16

## 2024-04-28 DIAGNOSIS — D509 Iron deficiency anemia, unspecified: Secondary | ICD-10-CM | POA: Diagnosis not present

## 2024-04-28 DIAGNOSIS — D508 Other iron deficiency anemias: Secondary | ICD-10-CM

## 2024-04-28 MED ORDER — IRON SUCROSE 20 MG/ML IV SOLN
200.0000 mg | INTRAVENOUS | Status: DC
  Administered 2024-04-28: 200 mg via INTRAVENOUS
  Filled 2024-04-28: qty 10

## 2024-04-28 NOTE — Patient Instructions (Signed)

## 2024-04-29 ENCOUNTER — Ambulatory Visit: Admitting: Dermatology

## 2024-04-29 DIAGNOSIS — D692 Other nonthrombocytopenic purpura: Secondary | ICD-10-CM

## 2024-04-29 DIAGNOSIS — C44212 Basal cell carcinoma of skin of right ear and external auricular canal: Secondary | ICD-10-CM | POA: Diagnosis not present

## 2024-04-29 DIAGNOSIS — D1801 Hemangioma of skin and subcutaneous tissue: Secondary | ICD-10-CM

## 2024-04-29 DIAGNOSIS — D492 Neoplasm of unspecified behavior of bone, soft tissue, and skin: Secondary | ICD-10-CM

## 2024-04-29 DIAGNOSIS — L821 Other seborrheic keratosis: Secondary | ICD-10-CM

## 2024-04-29 DIAGNOSIS — W908XXA Exposure to other nonionizing radiation, initial encounter: Secondary | ICD-10-CM

## 2024-04-29 DIAGNOSIS — L578 Other skin changes due to chronic exposure to nonionizing radiation: Secondary | ICD-10-CM

## 2024-04-29 DIAGNOSIS — L814 Other melanin hyperpigmentation: Secondary | ICD-10-CM

## 2024-04-29 DIAGNOSIS — L820 Inflamed seborrheic keratosis: Secondary | ICD-10-CM | POA: Diagnosis not present

## 2024-04-29 DIAGNOSIS — Z1283 Encounter for screening for malignant neoplasm of skin: Secondary | ICD-10-CM | POA: Diagnosis not present

## 2024-04-29 DIAGNOSIS — D229 Melanocytic nevi, unspecified: Secondary | ICD-10-CM

## 2024-04-29 DIAGNOSIS — L570 Actinic keratosis: Secondary | ICD-10-CM | POA: Diagnosis not present

## 2024-04-29 DIAGNOSIS — Z85828 Personal history of other malignant neoplasm of skin: Secondary | ICD-10-CM

## 2024-04-29 NOTE — Patient Instructions (Addendum)
 Cryotherapy Aftercare  Wash gently with soap and water everyday.   Apply Vaseline and Band-Aid daily until healed.    Electrodesiccation and Curettage ("Scrape and Burn") Wound Care Instructions  Leave the original bandage on for 24 hours if possible.  If the bandage becomes soaked or soiled before that time, it is OK to remove it and examine the wound.  A small amount of post-operative bleeding is normal.  If excessive bleeding occurs, remove the bandage, place gauze over the site and apply continuous pressure (no peeking) over the area for 30 minutes. If this does not work, please call our clinic as soon as possible or page your doctor if it is after hours.   Once a day, cleanse the wound with soap and water. It is fine to shower. If a thick crust develops you may use a Q-tip dipped into dilute hydrogen peroxide (mix 1:1 with water) to dissolve it.  Hydrogen peroxide can slow the healing process, so use it only as needed.    After washing, apply petroleum jelly (Vaseline) or an antibiotic ointment if your doctor prescribed one for you, followed by a bandage.    For best healing, the wound should be covered with a layer of ointment at all times. If you are not able to keep the area covered with a bandage to hold the ointment in place, this may mean re-applying the ointment several times a day.  Continue this wound care until the wound has healed and is no longer open. It may take several weeks for the wound to heal and close.  Itching and mild discomfort is normal during the healing process.  If you have any discomfort, you can take Tylenol (acetaminophen) or ibuprofen as directed on the bottle. (Please do not take these if you have an allergy to them or cannot take them for another reason).  Some redness, tenderness and white or yellow material in the wound is normal healing.  If the area becomes very sore and red, or develops a thick yellow-green material (pus), it may be infected; please  notify us.    Wound healing continues for up to one year following surgery. It is not unusual to experience pain in the scar from time to time during the interval.  If the pain becomes severe or the scar thickens, you should notify the office.    A slight amount of redness in a scar is expected for the first six months.  After six months, the redness will fade and the scar will soften and fade.  The color difference becomes less noticeable with time.  If there are any problems, return for a post-op surgery check at your earliest convenience.  To improve the appearance of the scar, you can use silicone scar gel, cream, or sheets (such as Mederma or Serica) every night for up to one year. These are available over the counter (without a prescription).  Please call our office at [phone] for any questions or concerns.   Due to recent changes in healthcare laws, you may see results of your pathology and/or laboratory studies on MyChart before the doctors have had a chance to review them. We understand that in some cases there may be results that are confusing or concerning to you. Please understand that not all results are received at the same time and often the doctors may need to interpret multiple results in order to provide you with the best plan of care or course of treatment. Therefore, we ask that  you please give [place] 2 business days to thoroughly review all your results before contacting the office for clarification. Should we see a critical lab result, you will be contacted sooner.   If You Need Anything After Your Visit  If you have any questions or concerns for your doctor, please call our main line at [phone] and press option 4 to reach your doctor's medical assistant. If no one answers, please leave a voicemail as directed and we will return your call as soon as possible. Messages left after 4 pm will be answered the following business day.   You may also send [place] a message via  MyChart. We typically respond to MyChart messages within 1-2 business days.  For prescription refills, please ask your pharmacy to contact our office. Our fax number is [phone].  If you have an urgent issue when the clinic is closed that cannot wait until the next business day, you can page your doctor at the number below.    Please note that while we do our best to be available for urgent issues outside of office hours, we are not available 24/7.   If you have an urgent issue and are unable to reach [place], you may choose to seek medical care at your doctor's office, retail clinic, urgent care center, or emergency room.  If you have a medical emergency, please immediately call 911 or go to the emergency department.  Pager Numbers  - Dr. Gwen Pounds: [phone]  - Dr. Roseanne Reno: [phone]  - Dr. Katrinka Blazing: [phone]   In the event of inclement weather, please call our main line at [phone] for an update on the status of any delays or closures.  Dermatology Medication Tips: Please keep the boxes that topical medications come in in order to help keep track of the instructions about where and how to use these. Pharmacies typically print the medication instructions only on the boxes and not directly on the medication tubes.   If your medication is too expensive, please contact our office at [phone] option 4 or send [place] a message through MyChart.   We are unable to tell what your co-pay for medications will be in advance as this is different depending on your insurance coverage. However, we may be able to find a substitute medication at lower cost or fill out paperwork to get insurance to cover a needed medication.   If a prior authorization is required to get your medication covered by your insurance company, please allow [place] 1-2 business days to complete this process.  Drug prices often vary depending on where the prescription is filled and some pharmacies may offer cheaper  prices.  The website www.goodrx.com contains coupons for medications through different pharmacies. The prices here do not account for what the cost may be with help from insurance (it may be cheaper with your insurance), but the website can give you the price if you did not use any insurance.  - You can print the associated coupon and take it with your prescription to the pharmacy.  - You may also stop by our office during regular business hours and pick up a GoodRx coupon card.  - If you need your prescription sent electronically to a different pharmacy, notify our office through [facility] or by phone at [phone] option 4.     Si Usted Necesita Algo Despus de Su Visita  Tambin puede enviarnos un mensaje a travs de Clinical cytogeneticist. Por lo general respondemos a los Public relations account executive  transcurso de 1 a 2 das hbiles.  Para renovar recetas, por favor pida a su farmacia que se ponga en contacto con nuestra oficina. Franz Jacks de fax es [place] [phone].  Si tiene un asunto urgente cuando la clnica est cerrada y que no puede esperar hasta el siguiente da hbil, puede llamar/localizar a su doctor(a) al nmero que aparece a continuacin.   Por favor, tenga en cuenta que aunque hacemos todo lo posible para estar disponibles para asuntos urgentes fuera del horario de [place], no estamos disponibles las 24 horas del da, los 7 [address]  [address] de la [place].   Si tiene un problema urgente y no puede comunicarse con nosotros, puede optar por buscar atencin mdica  en el consultorio de su doctor(a), en una clnica privada, en un centro de atencin urgente o en una sala de emergencias.  Si tiene Engineer, drilling, por favor llame inmediatamente al 911 o vaya a la sala de emergencias.  Nmeros de bper  - Dr. Bary Likes: [phone]  - Dra. Annette Barters: [phone]  - Dr. Felipe Horton: [phone]   En caso de inclemencias del tiempo, por favor llame a Lajuan Pila principal al [phone]  para una actualizacin sobre el [place] de cualquier retraso o cierre.  Consejos para la medicacin en dermatologa: Por favor, guarde las cajas en las que vienen los medicamentos de uso tpico para ayudarle a seguir las instrucciones sobre dnde y cmo usarlos. Las farmacias generalmente imprimen las instrucciones del medicamento slo en las cajas y no directamente en los tubos del [place].   Si su medicamento es muy caro, por favor, pngase en contacto con Bettyjane Brunet llamando al [phone] y presione la opcin 4 o envenos un mensaje a travs de Clinical cytogeneticist.   No podemos decirle cul ser su copago por los medicamentos por adelantado ya que esto es diferente dependiendo de la cobertura de su seguro. Sin embargo, es posible que podamos encontrar un medicamento sustituto a Audiological scientist un formulario para que el seguro cubra el medicamento que se considera necesario.   Si se requiere una autorizacin previa para que su compaa de seguros [place] su medicamento, por favor permtanos de 1 a 2 das hbiles para completar este proceso.  Los precios de los medicamentos varan con frecuencia dependiendo del Environmental consultant de dnde se surte la receta y alguna farmacias pueden ofrecer precios ms baratos.  El sitio web www.goodrx.com tiene cupones para medicamentos de Health and safety inspector. Los precios aqu no tienen en cuenta lo que podra costar con la ayuda del seguro (puede ser ms barato con su seguro), pero el sitio web puede darle el precio si no utiliz Tourist information centre manager.  - Puede imprimir el cupn correspondiente y llevarlo con su receta a la farmacia.  - Tambin puede pasar por nuestra oficina durante el horario de atencin regular y Education officer, museum una tarjeta de cupones de GoodRx.  - Si necesita que su receta se enve electrnicamente a una farmacia diferente, informe a nuestra oficina a travs de MyChart de [place] o por telfono llamando al [phone] y presione la opcin 4.

## 2024-04-29 NOTE — Progress Notes (Unsigned)
 Follow-Up Visit   Subjective  Ronnie Buckley is a 73 y.o. male who presents for the following: Skin Cancer Screening and Upper Body Skin Exam. Hx Aks, BCC, and atypical mole.   Place at R ear and scalp. Unsure of how long they have been there.   Patient also has neck brace on due to erosion of C1 and C2.  The patient presents for Upper Body Skin Exam (UBSE) for skin cancer screening and mole check. The patient has spots, moles and lesions to be evaluated, some may be new or changing and the patient may have concern these could be cancer.  The following portions of the chart were reviewed this encounter and updated as appropriate: medications, allergies, medical history  Review of Systems:  No other skin or systemic complaints except as noted in HPI or Assessment and Plan.  Objective  Well appearing patient in no apparent distress; mood and affect are within normal limits.  All skin waist up examined. Relevant physical exam findings are noted in the Assessment and Plan.  R scalp x2 (2) Stuck on waxy paps with erythema Scalp/forehead/L ear x 22 (22) Pink scaly macules R posterior ear lobe 1.2 crusted papule    Assessment & Plan   INFLAMED SEBORRHEIC KERATOSIS (2) R scalp x2 (2) Symptomatic, irritating, patient would like treated. Destruction of lesion - R scalp x2 (2) Complexity: simple   Destruction method: cryotherapy   Informed consent: discussed and consent obtained   Timeout:  patient name, date of birth, surgical site, and procedure verified Lesion destroyed using liquid nitrogen: Yes   Region frozen until ice ball extended beyond lesion: Yes   Outcome: patient tolerated procedure well with no complications   Post-procedure details: wound care instructions given   Additional details:  Prior to procedure, discussed risks of blister formation, small wound, skin dyspigmentation, or rare scar following cryotherapy. Recommend Vaseline ointment to treated areas while  healing.   AK (ACTINIC KERATOSIS) (22) Scalp/forehead/L ear x 22 (22) Actinic keratoses are precancerous spots that appear secondary to cumulative UV radiation exposure/sun exposure over time. They are chronic with expected duration over 1 year. A portion of actinic keratoses will progress to squamous cell carcinoma of the skin. It is not possible to reliably predict which spots will progress to skin cancer and so treatment is recommended to prevent development of skin cancer.  Recommend daily broad spectrum sunscreen SPF 30+ to sun-exposed areas, reapply every 2 hours as needed.  Recommend staying in the shade or wearing long sleeves, sun glasses (UVA+UVB protection) and wide brim hats (4-inch brim around the entire circumference of the hat). Call for new or changing lesions. Destruction of lesion - Scalp/forehead/L ear x 22 (22) Complexity: simple   Destruction method: cryotherapy   Informed consent: discussed and consent obtained   Timeout:  patient name, date of birth, surgical site, and procedure verified Lesion destroyed using liquid nitrogen: Yes   Region frozen until ice ball extended beyond lesion: Yes   Outcome: patient tolerated procedure well with no complications   Post-procedure details: wound care instructions given   Additional details:  Prior to procedure, discussed risks of blister formation, small wound, skin dyspigmentation, or rare scar following cryotherapy. Recommend Vaseline ointment to treated areas while healing.   NEOPLASM OF SKIN R posterior ear lobe Epidermal / dermal shaving  Lesion diameter (cm):  1.2 Informed consent: discussed and consent obtained   Timeout: patient name, date of birth, surgical site, and procedure verified  Procedure prep:  Patient was prepped and draped in usual sterile fashion Prep type:  Isopropyl alcohol Anesthesia: the lesion was anesthetized in a standard fashion   Anesthetic:  1% lidocaine  w/ epinephrine 1-100,000 buffered w/  8.4% NaHCO3 Instrument used: flexible razor blade   Hemostasis achieved with: pressure, aluminum chloride and electrodesiccation   Outcome: patient tolerated procedure well   Post-procedure details: sterile dressing applied and wound care instructions given   Dressing type: bandage and petrolatum    Destruction of lesion Complexity: simple   Destruction method: electrodesiccation and curettage   Destruction method comment:  Electrodesiccation Informed consent: discussed and consent obtained   Timeout:  patient name, date of birth, surgical site, and procedure verified Patient was prepped and draped in usual sterile fashion: patient was prepped with isopropyl alcohol. Final wound size (cm):  1.2 Outcome: patient tolerated procedure well with no complications    Specimen 1 - Surgical pathology Differential Diagnosis: R/o BCC  Check Margins: No 1.2 crusted papule EDC today  SKIN CANCER SCREENING   HISTORY OF BASAL CELL CARCINOMA   ACTINIC SKIN DAMAGE   PURPURA (HCC)   LENTIGO   MELANOCYTIC NEVUS, UNSPECIFIED LOCATION   BASAL CELL CARCINOMA (BCC) OF RIGHT EARLOBE     Skin cancer screening performed today.  Actinic Damage - Chronic condition, secondary to cumulative UV/sun exposure - diffuse scaly erythematous macules with underlying dyspigmentation - Recommend daily broad spectrum sunscreen SPF 30+ to sun-exposed areas, reapply every 2 hours as needed.  - Staying in the shade or wearing long sleeves, sun glasses (UVA+UVB protection) and wide brim hats (4-inch brim around the entire circumference of the hat) are also recommended for sun protection.  - Call for new or changing lesions.  Lentigines, Seborrheic Keratoses, Hemangiomas - Benign normal skin lesions - Benign-appearing - Call for any changes  Melanocytic Nevi - Tan-brown and/or pink-flesh-colored symmetric macules and papules - Benign appearing on exam today - Observation - Call clinic for new or  changing moles - Recommend daily use of broad spectrum spf 30+ sunscreen to sun-exposed areas.   Purpura - Chronic; persistent and recurrent.  Treatable, but not curable. - Violaceous macules and patches at arms - Benign - Related to trauma, age, sun damage and/or use of blood thinners, chronic use of topical and/or oral steroids - Observe - Can use OTC arnica containing moisturizer such as Dermend Bruise Formula if desired - Call for worsening or other concerns  HISTORY OF BASAL CELL CARCINOMA OF THE SKIN Left preauricular area tx w/ ED&C 08/22/2023 - No evidence of recurrence today - Recommend regular full body skin exams - Recommend daily broad spectrum sunscreen SPF 30+ to sun-exposed areas, reapply every 2 hours as needed.  - Call if any new or changing lesions are noted between office visits  Return in about 7 months (around 11/29/2024) for w/ Dr. Hester, TBSE, HxBCC.  I, Jacquelynn V. Wilfred, CMA, am acting as scribe for Alm Hester, MD .   Documentation: I have reviewed the above documentation for accuracy and completeness, and I agree with the above.  Alm Hester, MD

## 2024-05-01 ENCOUNTER — Encounter: Payer: Self-pay | Admitting: Dermatology

## 2024-05-01 ENCOUNTER — Ambulatory Visit: Payer: Self-pay | Admitting: Dermatology

## 2024-05-01 LAB — SURGICAL PATHOLOGY

## 2024-05-05 ENCOUNTER — Inpatient Hospital Stay: Attending: Oncology

## 2024-05-05 VITALS — BP 104/62 | HR 58 | Temp 97.4°F | Resp 18

## 2024-05-05 DIAGNOSIS — Z85810 Personal history of malignant neoplasm of tongue: Secondary | ICD-10-CM | POA: Insufficient documentation

## 2024-05-05 DIAGNOSIS — Z87891 Personal history of nicotine dependence: Secondary | ICD-10-CM | POA: Diagnosis not present

## 2024-05-05 DIAGNOSIS — Z79899 Other long term (current) drug therapy: Secondary | ICD-10-CM | POA: Insufficient documentation

## 2024-05-05 DIAGNOSIS — Z85828 Personal history of other malignant neoplasm of skin: Secondary | ICD-10-CM | POA: Insufficient documentation

## 2024-05-05 DIAGNOSIS — D508 Other iron deficiency anemias: Secondary | ICD-10-CM

## 2024-05-05 DIAGNOSIS — D509 Iron deficiency anemia, unspecified: Secondary | ICD-10-CM | POA: Insufficient documentation

## 2024-05-05 DIAGNOSIS — Z9221 Personal history of antineoplastic chemotherapy: Secondary | ICD-10-CM | POA: Insufficient documentation

## 2024-05-05 DIAGNOSIS — Z923 Personal history of irradiation: Secondary | ICD-10-CM | POA: Insufficient documentation

## 2024-05-05 MED ORDER — IRON SUCROSE 20 MG/ML IV SOLN
200.0000 mg | INTRAVENOUS | Status: DC
  Administered 2024-05-05: 200 mg via INTRAVENOUS
  Filled 2024-05-05: qty 10

## 2024-05-05 NOTE — Patient Instructions (Signed)

## 2024-05-16 ENCOUNTER — Inpatient Hospital Stay

## 2024-05-16 VITALS — BP 116/64 | HR 64 | Temp 97.5°F | Resp 17

## 2024-05-16 DIAGNOSIS — D509 Iron deficiency anemia, unspecified: Secondary | ICD-10-CM | POA: Diagnosis not present

## 2024-05-16 DIAGNOSIS — D508 Other iron deficiency anemias: Secondary | ICD-10-CM

## 2024-05-16 MED ORDER — SODIUM CHLORIDE 0.9% FLUSH
10.0000 mL | Freq: Once | INTRAVENOUS | Status: AC | PRN
Start: 2024-05-16 — End: 2024-05-16
  Administered 2024-05-16: 10 mL
  Filled 2024-05-16: qty 10

## 2024-05-16 MED ORDER — IRON SUCROSE 20 MG/ML IV SOLN
200.0000 mg | INTRAVENOUS | Status: DC
  Administered 2024-05-16: 200 mg via INTRAVENOUS
  Filled 2024-05-16: qty 10

## 2024-05-16 NOTE — Progress Notes (Signed)
 Patient tolerated Venofer  infusion well. Explained recommendation of 30 min post monitoring. Patient refused to wait post monitoring. Educated on what signs to watch for & to call with any concerns. No questions, discharged. Stable

## 2024-05-16 NOTE — Progress Notes (Signed)
 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  S/p total glossectomy with bilateral neck dissection on 07/15/20 at [facility].  PEG placed on 09/22/20 at [facility].  Patient completed chemotherapy on 11/30/20 and radiation on 12/03/20.  Currently on surveillance.    Met with patient during venofer  infusion.  Patient has been giving 6 cartons of glucerna 1.5 (2 cartons TID).  Flushing with 240-339ml of water at each feeding.  Has been trying to give more fluids via tube with hot weather.  No issues or concerns with tube feeding.    Still wearing neck brace.  Unable to take anything by mouth at this time.     Anthropometrics:   Weight 198 lb today on infusion scale 190 lb 3.2 oz on 7/10 191 lb on 1/3 183 lb on 7/13 179 lb on 12/29/21  Estimated Energy Needs  Kcals: 2150-2580 Protein: 108-129 g Fluid: 2150-2580  NUTRITION DIAGNOSIS: Inadequate oral intake, relying on feeding tube.     INTERVENTION:  Continue glurcerna 1.5, 6 cartons per day (2 cartons TID).  Flush with 240-300 ml water at each feeding.   Provides 2136 kcals, 117 g protein and 1800-1980 ml free water (with water flush and free water in formula).   Patient enteral supplies are coming from [facility] Patient has RD contact information    MONITORING, EVALUATION, GOAL: weight trends, tube feeding   NEXT VISIT: Tuesday Jan 13 after MD appt  Diego B. Dasie SOLON, CSO, LDN Registered Dietitian [phone]

## 2024-05-16 NOTE — Patient Instructions (Signed)

## 2024-05-19 NOTE — Telephone Encounter (Signed)
 Patient's sister advised of BX results. aw

## 2024-05-22 ENCOUNTER — Inpatient Hospital Stay

## 2024-05-22 VITALS — BP 104/55 | HR 64 | Temp 96.6°F | Resp 17

## 2024-05-22 DIAGNOSIS — D509 Iron deficiency anemia, unspecified: Secondary | ICD-10-CM | POA: Diagnosis not present

## 2024-05-22 DIAGNOSIS — D508 Other iron deficiency anemias: Secondary | ICD-10-CM

## 2024-05-22 MED ORDER — SODIUM CHLORIDE 0.9% FLUSH
10.0000 mL | Freq: Once | INTRAVENOUS | Status: AC | PRN
Start: 2024-05-22 — End: 2024-05-22
  Administered 2024-05-22: 10 mL
  Filled 2024-05-22: qty 10

## 2024-05-22 MED ORDER — IRON SUCROSE 20 MG/ML IV SOLN
200.0000 mg | INTRAVENOUS | Status: DC
  Administered 2024-05-22: 200 mg via INTRAVENOUS
  Filled 2024-05-22: qty 10

## 2024-07-15 ENCOUNTER — Other Ambulatory Visit: Payer: Self-pay

## 2024-07-15 DIAGNOSIS — R1312 Dysphagia, oropharyngeal phase: Secondary | ICD-10-CM

## 2024-07-15 MED ORDER — GLUCERNA 1.5 CAL PO LIQD: ORAL | Status: AC

## 2024-10-14 ENCOUNTER — Inpatient Hospital Stay

## 2024-10-14 ENCOUNTER — Encounter: Payer: Self-pay | Admitting: Oncology

## 2024-10-14 ENCOUNTER — Inpatient Hospital Stay: Attending: Oncology

## 2024-10-14 ENCOUNTER — Inpatient Hospital Stay: Admitting: Oncology

## 2024-10-14 VITALS — BP 101/63 | HR 64 | Temp 96.9°F | Resp 19 | Ht 70.0 in | Wt 191.7 lb

## 2024-10-14 DIAGNOSIS — D509 Iron deficiency anemia, unspecified: Secondary | ICD-10-CM | POA: Diagnosis not present

## 2024-10-14 DIAGNOSIS — R1312 Dysphagia, oropharyngeal phase: Secondary | ICD-10-CM | POA: Diagnosis not present

## 2024-10-14 DIAGNOSIS — Z8589 Personal history of malignant neoplasm of other organs and systems: Secondary | ICD-10-CM | POA: Diagnosis not present

## 2024-10-14 DIAGNOSIS — Z87891 Personal history of nicotine dependence: Secondary | ICD-10-CM | POA: Diagnosis not present

## 2024-10-14 DIAGNOSIS — Z08 Encounter for follow-up examination after completed treatment for malignant neoplasm: Secondary | ICD-10-CM | POA: Diagnosis not present

## 2024-10-14 LAB — CBC WITH DIFFERENTIAL (CANCER CENTER ONLY)
Abs Immature Granulocytes: 0.02 K/uL (ref 0.00–0.07)
Basophils Absolute: 0 K/uL (ref 0.0–0.1)
Basophils Relative: 1 %
Eosinophils Absolute: 0.1 K/uL (ref 0.0–0.5)
Eosinophils Relative: 3 %
HCT: 38.5 % — ABNORMAL LOW (ref 39.0–52.0)
Hemoglobin: 12.9 g/dL — ABNORMAL LOW (ref 13.0–17.0)
Immature Granulocytes: 1 %
Lymphocytes Relative: 19 %
Lymphs Abs: 0.8 K/uL (ref 0.7–4.0)
MCH: 33.2 pg (ref 26.0–34.0)
MCHC: 33.5 g/dL (ref 30.0–36.0)
MCV: 99 fL (ref 80.0–100.0)
Monocytes Absolute: 0.4 K/uL (ref 0.1–1.0)
Monocytes Relative: 9 %
Neutro Abs: 2.9 K/uL (ref 1.7–7.7)
Neutrophils Relative %: 67 %
Platelet Count: 146 K/uL — ABNORMAL LOW (ref 150–400)
RBC: 3.89 MIL/uL — ABNORMAL LOW (ref 4.22–5.81)
RDW: 12.4 % (ref 11.5–15.5)
WBC Count: 4.3 K/uL (ref 4.0–10.5)
nRBC: 0 % (ref 0.0–0.2)

## 2024-10-14 LAB — FERRITIN: Ferritin: 84 ng/mL (ref 24–336)

## 2024-10-14 LAB — IRON AND TIBC
Iron: 51 ug/dL (ref 45–182)
Saturation Ratios: 16 % — ABNORMAL LOW (ref 17.9–39.5)
TIBC: 314 ug/dL (ref 250–450)
UIBC: 263 ug/dL

## 2024-10-14 NOTE — Progress Notes (Signed)
 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  S/p total glossectomy with bilateral neck dissection on 07/15/20 at [facility].  PEG placed on 09/22/20 at [facility].  Patient completed chemotherapy on 11/30/20 and radiation on 12/03/20. Currently on surveilliance  Met with patient in clinic. 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  Continues with glucerna 1.5 tube feeding 2 cartons TID bolus feeding.  Water flush of 240-300ml at each feeding.  Reports bowel movement daily or every other day.  Able to drink water, tea at this time.      Medications: reviewed  Labs: Hgb 12.9  Anthropometrics:   Weight 191 lb today 198 lb on 05/16/24 (infusion scale) 190 lb 3.2 oz on 7/10 191 lb on 10/05/23 183 lb on 04/14/23 179 lb on 12/29/21   Estimated Energy Needs  Kcals: 2150-2580 Protein: 108-129 g Fluid: 2150-2580 ml  NUTRITION DIAGNOSIS: Inadequate oral intake, relying on feeding tube   INTERVENTION:  Continue glucerna 1.5, 6 cartons per day via bolus (2 carton TID). Flush with 240-337ml at each feeding. Provides 2136 kcals, 117 g protein, 1800-1980 ml free water (free water flush and in formula). [facility] provides enteral supplies Patient has RD contact information     MONITORING, EVALUATION, GOAL: weight trends, tube feeding tolerance   NEXT VISIT: Tuesday, July 14 after MD  Diego B. Dasie SOLON, CSO, LDN Registered Dietitian [phone]

## 2024-10-14 NOTE — Progress Notes (Signed)
 Patient doing good; no new or acute concerns at this time.

## 2024-10-14 NOTE — Progress Notes (Signed)
 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 Floretta for a lesion that he identified on his left tongue.  Biopsy on 06/15/2020 was consistent with squamous cell carcinoma.  This was followed by a CT neck and chest.  CT neck showed a large poorly defined infiltrative mass throughout the posterior left oral tongue and left oral base extending to the left glossotonsillar sulcus.  The mass crosses midline through the lingual septum into the right hemitongue.  Findings are concerning for left lateral retropharyngeal as well as level 1 and 4 nodal metastases.  CT chest without contrast did not show any evidence of metastatic disease.  Patient then underwent total glossectomy with bilateral neck dissection on 07/15/2020.   Pathology showed:Surgical Pathology Final Pathologic Diagnosis  A. LYMPH NODE, LEFT LEVEL 1B, DISSECTION: Benign salivary gland tissue. One out of six lymph nodes, positive for metastatic carcinoma (1/6).   B. LYMPH NODES, LEFT LEVEL 2A, 3, & 4, DISSECTION: Sixteen lymph nodes, negative for metastatic carcinoma (0/16).   C. LYMPH NODES, LEFT LEVEL 2B, DISSECTION: Seven lymph nodes, negative for metastatic carcinoma (0/7).   D. LYMPH NODES, LEFT LEVEL 1A, DISSECTION: One lymph node, negative for metastatic carcinoma  (0/1).   E. LYMPH NODES, RIGHT LEVEL 2A, 3, &4, DISSECTION: Eleven lymph nodes, negative for metastatic carcinoma (0/11).   F. LYMPH NODES, RIGHT LEVEL 2A AND 2B, DISSECTION: Eleven lymph nodes, negative for metastatic carcinoma(0/11).   G. LINGUAL NERVE, RIGHT, EXCISION: Benign nerve tissue. No malignancy identified.   H. LYMPH NODES, RIGHT LEVEL 1B, DISSECTION: Benign salivary gland tissue. Three lymph nodes, negative for metastatic carcinoma. (0/3).   I. LINGUAL NERVE, PROXIMAL LEFT MARGIN, EXCISION: Benign peripheral nerve. No malignancy identified.   J. FLOOR OF MOUTH, LEFT MARGIN, EXCISION: Benign oropharyngeal mucosa. No malignancy identified.   K. SOFT PALATE, LEFT MARGIN, EXCISION: Benign oropharyngeal mucosa. No malignancy identified.   L. PHARYNGEAL MUCOSAL MARGIN, LEFT, EXCISION: Benign oropharyngeal mucosa. No malignancy identified.   M. VALLECULA MUCOSA MARGIN, EXCISION: Benign oropharyngeal mucosa. No malignancy identified.   N. PTERYGOID, DEEP TONSILLAR FOSSA MARGIN, EXCISION: Benign soft tissue. No malignancy identified.   O. TONGUE, TOTAL GLOSSECTOMY: Invasive moderately differentiated squamous cell carcinoma, keratinizing (4.6 cm). Tumor focally present at the deep and left tissue edges, please see separated margin status.  Lymphovascular invasion identified.  Perineural invasion is identified. Pathologic stage: pT4a pN1.  See CAP synoptic report.     Patient was evaluated by radiation oncology at [facility] and plan was to offer adjuvant concurrent chemoradiation.  However patient lives in [place] and prefers to get care locally.   He also underwent tracheostomy removal, port placement and PEG tube placement at [facility].  Patient completed concurrent chemoradiation with weekly cisplatin  in March 2022.He remains in remission    Interval history-overall patient is doing well.  He continues to get his nutrition through feeding tube.  He had  his ENT surveillance visit in November 2025 which was unremarkable.  ECOG PS- 1 Pain scale- 0   Review of systems- Review of Systems  Constitutional:  Negative for chills, fever, malaise/fatigue and weight loss.  HENT:  Negative for congestion, ear discharge and nosebleeds.   Eyes:  Negative for blurred vision.  Respiratory:  Negative for cough, hemoptysis, sputum production, shortness of breath and wheezing.   Cardiovascular:  Negative for chest pain, palpitations, orthopnea and claudication.  Gastrointestinal:  Negative for abdominal pain, blood in stool, constipation, diarrhea, heartburn, melena, nausea and vomiting.  Genitourinary:  Negative for dysuria, flank pain, frequency, hematuria and urgency.  Musculoskeletal:  Negative for back pain, joint pain and myalgias.  Skin:  Negative for rash.  Neurological:  Negative for dizziness, tingling, focal weakness, seizures, weakness and headaches.  Endo/Heme/Allergies:  Does not bruise/bleed easily.  Psychiatric/Behavioral:  Negative for depression and suicidal ideas. The patient does not have insomnia.       Allergies[1]   Past Medical History:  Diagnosis Date   Actinic keratosis 03/03/2010   left dorsum hand, bx proven   Atypical mole 03/21/2023   left posterior waistline, moderate atypia   Basal cell carcinoma 08/22/2023   L preauricular - ED&C   Basal cell carcinoma 04/29/2024   Right posterior earlobe. Nodular, infiltrative. EDC.     History reviewed. No pertinent surgical history.  Social History   Socioeconomic History   Marital status: Widowed    Spouse name: Not on file   Number of children: Not on file   Years of education: Not on file   Highest education level: Not on file  Occupational History   Not on file  Tobacco Use   Smoking status: Former    Types: Cigarettes   Smokeless tobacco: Never   Tobacco comments:    60 years ago  Vaping Use   Vaping status: Never Used  Substance and Sexual Activity    Alcohol use: Not Currently   Drug use: Never   Sexual activity: Not Currently  Other Topics Concern   Not on file  Social History Narrative   Not on file   Social Drivers of Health   Tobacco Use: Medium Risk (10/14/2024)   Patient History    Smoking Tobacco Use: Former    Smokeless Tobacco Use: Never    Passive Exposure: Not on file  Financial Resource Strain: Low Risk  (02/06/2024)   Received from [facility]   Overall Financial Resource Strain (CARDIA)    Difficulty of Paying Living Expenses: Not hard at all  Food Insecurity: No Food Insecurity (02/06/2024)   Received from [facility]   Epic    Within the past 12 months, you worried that your food would run out before you got the money to buy more.: Never true    Within the past 12 months, the food you bought just didn't last and you didn't have money to get more.: Never true  Transportation Needs: No Transportation Needs (02/06/2024)   Received from [facility] - Transportation    In the past 12 months, has lack of transportation kept you from medical appointments or from getting medications?: No    Lack of Transportation (Non-Medical): No  Physical Activity: Not on file  Stress: Not on file  Social Connections: Not on file  Intimate Partner Violence: Not on file  Depression (PHQ2-9): Low Risk (10/14/2024)   Depression (PHQ2-9)    PHQ-2  Score: 0  Alcohol Screen: Not on file  Housing: Low Risk  (08/13/2024)   Received from [facility]   Epic    In the last 12 months, was there a time when you were not able to pay the mortgage or rent on time?: No    In the past 12 months, how many times have you moved where you were living?: 0    At any time in the past 12 months, were you homeless or living in a shelter (including now)?: No  Utilities: Not At Risk (02/06/2024)   Received from [facility] Utilities    Threatened with loss of  utilities: No  Health Literacy: Not on file    History reviewed. No pertinent family history.  Current Medications[2]  Physical exam:  Vitals:   10/14/24 1307  BP: 101/63  Pulse: 64  Resp: 19  Temp: (!) 96.9 F (36.1 C)  TempSrc: Tympanic  SpO2: 97%  Weight: 191 lb 11.2 oz (87 kg)  Height: 5' 10 (1.778 m)   Physical Exam HENT:     Mouth/Throat:     Comments: Patient is s/p total glossectomy. Cardiovascular:     Rate and Rhythm: Normal rate and regular rhythm.     Heart sounds: Normal heart sounds.  Pulmonary:     Effort: Pulmonary effort is normal.     Breath sounds: Normal breath sounds.  Abdominal:     General: Bowel sounds are normal.     Palpations: Abdomen is soft.     Comments: PEG tube in place  Lymphadenopathy:     Comments: No palpable cervical adenopathy  Skin:    General: Skin is warm and dry.  Neurological:     Mental Status: He is alert and oriented to person, place, and time.      I have personally reviewed labs listed below:    Latest Ref Rng & Units 10/17/2023   12:29 PM  CMP  Glucose 70 - 99 mg/dL 828   BUN 8 - 23 mg/dL 34   Creatinine 9.38 - 1.24 mg/dL 9.29   Sodium 864 - 854 mmol/L 137   Potassium 3.5 - 5.1 mmol/L 4.8   Chloride 98 - 111 mmol/L 99   CO2 22 - 32 mmol/L 26   Calcium 8.9 - 10.3 mg/dL 9.0   Total Protein 6.5 - 8.1 g/dL 7.0   Total Bilirubin 0.0 - 1.2 mg/dL 0.6   Alkaline Phos 38 - 126 U/L 62   AST 15 - 41 U/L 16   ALT 0 - 44 U/L 16       Latest Ref Rng & Units 10/14/2024   12:39 PM  CBC  WBC 4.0 - 10.5 K/uL 4.3   Hemoglobin 13.0 - 17.0 g/dL 87.0   Hematocrit 60.9 - 52.0 % 38.5   Platelets 150 - 400 K/uL 146      Assessment and plan- Patient is a 73 y.o. male  with history of squamous cell carcinoma of the oral cavity s/p surgery and concurrent chemoradiation.  He is currently in remission and this is a routine follow-up visit  Assessment and Plan    History of squamous cell carcinoma of oral cavity currently  in remission  Under regular otolaryngology surveillance.  Clinically he is doing well with no signs and symptoms of recurrence based on today's exam - Scheduled follow-up with ENT in March 2026. - Planned follow-up in hematology/oncology clinic in six months.  Iron  deficiency anemia  Received intravenous iron . Hemoglobin improved to 12.9 g/dL. Pending iron  studies to determine further supplementation needs. - Will contact him if additional iron  supplementation is indicated based on results.         Visit Diagnosis 1. Encounter for follow-up surveillance of head and neck cancer   2. Oropharyngeal dysphagia      Dr. Annah Skene, MD, MPH [facility] [phone] 10/14/2024 2:25 PM                   [1]  Allergies Allergen Reactions   Tape     Redness and bleeding with some tapes and adhesives.  [2]  Current Outpatient Medications:    Ascorbic Acid (VITAMIN C) 1000 MG tablet, Take 1,000 mg by mouth daily., Disp: , Rfl:    DENTAGEL 1.1 % GEL dental gel, PLEASE SEE ATTACHED FOR DETAILED DIRECTIONS, Disp: , Rfl:    enalapril (VASOTEC) 5 MG tablet, Take 5 mg by mouth daily., Disp: , Rfl:    famotidine (PEPCID) 40 MG/5ML suspension, Take 40 mg by mouth 2 (two) times daily., Disp: , Rfl:    glipiZIDE (GLUCOTROL) 10 MG tablet, Take 10 mg by mouth daily before breakfast., Disp: , Rfl:    LORazepam  (ATIVAN ) 0.5 MG tablet, Take 1 tablet (0.5 mg total) by mouth every 8 (eight) hours as needed for anxiety., Disp: 30 tablet, Rfl: 0   metFORMIN (GLUCOPHAGE) 500 MG tablet, Take 1,000 mg by mouth daily. Take two once daily, Disp: , Rfl:    Multiple Vitamin (MULTI-VITAMIN) tablet, Take 1 tablet by mouth daily., Disp: , Rfl:    Nutritional Supplements (FEEDING SUPPLEMENT, GLUCERNA 1.5 CAL,) LIQD, Give 2 cartons three times a day via bolus syringe in feeding tube., Disp: 1422 mL, Rfl: 365   pravastatin (PRAVACHOL) 40 MG tablet, Take 1 tablet by mouth daily.,  Disp: , Rfl:   "

## 2024-11-25 ENCOUNTER — Ambulatory Visit: Admitting: Dermatology

## 2025-04-13 ENCOUNTER — Inpatient Hospital Stay

## 2025-04-13 ENCOUNTER — Inpatient Hospital Stay: Admitting: Oncology

## 2025-04-14 ENCOUNTER — Inpatient Hospital Stay

## 2025-04-14 ENCOUNTER — Inpatient Hospital Stay: Admitting: Oncology
